# Patient Record
Sex: Male | Born: 1980 | Race: Asian | Hispanic: No | Marital: Married | Smoking: Former smoker
Health system: Southern US, Community
[De-identification: ages and names within clinical notes are randomized; demographics above are authoritative.]

## PROBLEM LIST (undated history)

## (undated) DIAGNOSIS — E785 Hyperlipidemia, unspecified: Secondary | ICD-10-CM

## (undated) DIAGNOSIS — E119 Type 2 diabetes mellitus without complications: Secondary | ICD-10-CM

## (undated) DIAGNOSIS — I10 Essential (primary) hypertension: Secondary | ICD-10-CM

## (undated) DIAGNOSIS — F419 Anxiety disorder, unspecified: Secondary | ICD-10-CM

## (undated) HISTORY — DX: Hyperlipidemia, unspecified: E78.5

## (undated) HISTORY — DX: Type 2 diabetes mellitus without complications: E11.9

## (undated) HISTORY — DX: Anxiety disorder, unspecified: F41.9

## (undated) HISTORY — DX: Essential (primary) hypertension: I10

---

## 2013-04-03 ENCOUNTER — Encounter (HOSPITAL_COMMUNITY): Payer: Self-pay | Admitting: Psychiatry

## 2013-04-03 ENCOUNTER — Ambulatory Visit (INDEPENDENT_AMBULATORY_CARE_PROVIDER_SITE_OTHER): Payer: 59 | Admitting: Psychiatry

## 2013-04-03 VITALS — BP 130/80 | HR 64 | Resp 12 | Ht 68.0 in | Wt 185.2 lb

## 2013-04-03 DIAGNOSIS — F329 Major depressive disorder, single episode, unspecified: Secondary | ICD-10-CM

## 2013-04-03 MED ORDER — ESCITALOPRAM OXALATE 10 MG PO TABS: 10.0000 mg | ORAL_TABLET | Freq: Every day | ORAL | Status: DC
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
## 2013-04-08 ENCOUNTER — Ambulatory Visit (INDEPENDENT_AMBULATORY_CARE_PROVIDER_SITE_OTHER): Payer: 59 | Admitting: Marriage and Family Therapist

## 2013-04-08 DIAGNOSIS — F329 Major depressive disorder, single episode, unspecified: Secondary | ICD-10-CM
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
## 2013-04-14 ENCOUNTER — Ambulatory Visit (INDEPENDENT_AMBULATORY_CARE_PROVIDER_SITE_OTHER): Payer: 59 | Admitting: Marriage and Family Therapist

## 2013-04-14 DIAGNOSIS — F329 Major depressive disorder, single episode, unspecified: Secondary | ICD-10-CM

## 2013-04-14 NOTE — Progress Notes (Signed)
   THERAPIST PROGRESS NOTE  Session Time:  1:00 - 2:00 p.m.  Participation Level: Active  Behavioral Response: CasualAlertDepressed (mild)  Type of Therapy: Individual Therapy  Treatment Goals addressed: Coping  Interventions: Strength-based and Supportive  Summary: Adrian Reed is a 32 y.o. male who presents with depression.  He was referred by Dr. Lolly Mustache.  Patient reports his depression is steadily improving.  He reports talking to his girlfriend about seeing over time what will happen with their relationship.  He reports his girlfriend is in therapy to help her figure out how to decide what to do with their relationship.  He also states his mother decided to go to therapy over the incident as well.  Patient signed his treatment plan.  Suicidal/Homicidal: Nowithout intent/plan  Therapist Response:  Asked for update relating to how patient is coping. Patient admitted that since coming to therapy he realized "there is a lot more going on in my head than I realized" (i.e., thought process).  Also reviewed his treatment plan including letting him know he would be regularly assessed for suicide.  Began treatment beginning with explaining cognitive-behavioral therapy and patient identified cognitive distortions he uses including personalizing, overgeneralizing, ruminating, and using "should" statements.  His homework is to identify when he is using cognitive distortions.  Also introduced him to biofeedback and explained meditation since patient states he has never meditated.  Gave him brochure on [employer] biofeedback for him to review.  Plan: Return again in 1 week.  Diagnosis: Axis I: Depressive d/o NOS    Axis II: Deferred    Ashleymarie Granderson, LMFT, CTS 04/14/2013

## 2013-04-21 ENCOUNTER — Ambulatory Visit (INDEPENDENT_AMBULATORY_CARE_PROVIDER_SITE_OTHER): Payer: 59 | Admitting: Psychiatry

## 2013-04-21 ENCOUNTER — Encounter (HOSPITAL_COMMUNITY): Payer: Self-pay | Admitting: Psychiatry

## 2013-04-21 VITALS — BP 152/92 | HR 76 | Wt 191.4 lb

## 2013-04-21 DIAGNOSIS — F329 Major depressive disorder, single episode, unspecified: Secondary | ICD-10-CM

## 2013-04-21 MED ORDER — ESCITALOPRAM OXALATE 10 MG PO TABS: 10.0000 mg | ORAL_TABLET | Freq: Every day | ORAL | Status: DC
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
## 2013-04-24 ENCOUNTER — Ambulatory Visit (HOSPITAL_COMMUNITY): Payer: 59 | Admitting: Psychiatry

## 2013-04-30 ENCOUNTER — Ambulatory Visit (INDEPENDENT_AMBULATORY_CARE_PROVIDER_SITE_OTHER): Payer: 59 | Admitting: Marriage and Family Therapist

## 2013-04-30 DIAGNOSIS — F329 Major depressive disorder, single episode, unspecified: Secondary | ICD-10-CM
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
## 2013-05-06 ENCOUNTER — Ambulatory Visit (INDEPENDENT_AMBULATORY_CARE_PROVIDER_SITE_OTHER): Payer: 59 | Admitting: Marriage and Family Therapist

## 2013-05-06 DIAGNOSIS — F329 Major depressive disorder, single episode, unspecified: Secondary | ICD-10-CM
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
## 2013-05-13 ENCOUNTER — Other Ambulatory Visit (HOSPITAL_COMMUNITY): Payer: Self-pay | Admitting: *Deleted

## 2013-05-13 DIAGNOSIS — F329 Major depressive disorder, single episode, unspecified: Secondary | ICD-10-CM

## 2013-05-14 ENCOUNTER — Telehealth (HOSPITAL_COMMUNITY): Payer: Self-pay

## 2013-05-14 ENCOUNTER — Ambulatory Visit (HOSPITAL_COMMUNITY): Payer: 59 | Admitting: Marriage and Family Therapist

## 2013-05-14 MED ORDER — ESCITALOPRAM OXALATE 10 MG PO TABS: 10.0000 mg | ORAL_TABLET | Freq: Every day | ORAL | Status: DC

## 2013-05-26 NOTE — Progress Notes (Signed)
   THERAPIST PROGRESS NOTE  Session Time:  3:00 - 4:00 p.m.  Participation Level: Active  Behavioral Response: CasualAlertFlat Affect/Depression (mild)  Type of Therapy: Individual Therapy  Treatment Goals addressed: Coping  Interventions: Strength-based, Biofeedback and Supportive  Summary: Adrian Reed is a 32 y.o. male who presents with depression.  He was referred by Dr. Lolly Mustache.  Patient reports he has been doing better.  He reports being able to cope with feeling sad by distracting himself with work and football.  He reports a few days ago when the weather turned colder he began to feel sad, almost crying, because he thought about what he and his fiance were doing last fall.  He reports talking to her about both taking time out versus having contact through emails and texting because "I can't keep going indefinitely like this."    Suicidal/Homicidal: Nowithout intent/plan  Therapist Response:  Assessed patient's depression which seems mild based on self-report.  Also, patient appears to be grieving moreso than depressed and talked about ways patient could go on himself without his girlfriend.  Discussed activities that had nothing to do with her but more with activities he enjoys.  He did say he likes football and watches it with a friend but that is about all other than going to work and coming home.  Patient committed to finding something else to do.  Also introduced patient to [facility] biofeedback software.  His heart-rate variability was illustrated through the software.  He was unable to maintain any coherence (calm, 3% of the time) but when he was taught a breathing technique adding a positive feeling he at least received coherence 29% of the time.  Talked to patient about his susceptibility to being more "cerebral" or intellectual and how these breathing techniques can help him to identify his feeling much sooner.  Gave the example of patient trying to hurt himself over his  fiance because he "tends to hold his feelings in."  Patient agreed.  His homework is also to do HeartMath breathing techniques five minutes before getting out of bed, 1-2 minutes throughout the day, and 10 minutes before going to bed.  He will report back next week.  Plan: Return again in 1 weeks.  Diagnosis: Axis I: Depressive Disorder NOS    Axis II: Deferred    Kylie Gros, LMFT, CTS 05/26/2013

## 2013-05-27 ENCOUNTER — Ambulatory Visit (INDEPENDENT_AMBULATORY_CARE_PROVIDER_SITE_OTHER): Payer: 59 | Admitting: Marriage and Family Therapist

## 2013-05-27 DIAGNOSIS — F329 Major depressive disorder, single episode, unspecified: Secondary | ICD-10-CM

## 2013-06-02 ENCOUNTER — Encounter (HOSPITAL_COMMUNITY): Payer: Self-pay | Admitting: Psychiatry

## 2013-06-02 ENCOUNTER — Ambulatory Visit (INDEPENDENT_AMBULATORY_CARE_PROVIDER_SITE_OTHER): Payer: 59 | Admitting: Psychiatry

## 2013-06-02 VITALS — BP 132/74 | HR 78 | Ht 68.0 in | Wt 198.6 lb

## 2013-06-02 DIAGNOSIS — F329 Major depressive disorder, single episode, unspecified: Secondary | ICD-10-CM

## 2013-06-02 MED ORDER — ESCITALOPRAM OXALATE 10 MG PO TABS: 10.0000 mg | ORAL_TABLET | Freq: Every day | ORAL | Status: DC
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
## 2013-06-10 ENCOUNTER — Encounter (INDEPENDENT_AMBULATORY_CARE_PROVIDER_SITE_OTHER): Payer: Self-pay

## 2013-06-10 ENCOUNTER — Ambulatory Visit (INDEPENDENT_AMBULATORY_CARE_PROVIDER_SITE_OTHER): Payer: 59 | Admitting: Marriage and Family Therapist

## 2013-06-10 DIAGNOSIS — F329 Major depressive disorder, single episode, unspecified: Secondary | ICD-10-CM

## 2013-06-11 NOTE — Progress Notes (Signed)
   THERAPIST PROGRESS NOTE  Session Time:  2:00 - 3:00 p.m.  Participation Level: Active  Behavioral Response: CasualAlertNo symptoms  Type of Therapy: Individual Therapy  Treatment Goals addressed: Coping  Interventions: Biofeedback/supportive/strength-based  Summary: Adrian Reed is a 32 y.o. male who presents with depression.  Patient was referred by Dr. Lolly Mustache.  Patient states he has been feeling no depression and had no incidences for the past four weeks where he was feeling depressed.  He reports he has had some communication with his fiance although less so since they decided she needed to work on her situation first.  He did say he noticed he needs to do more socially other than come home and go to work.  He reports spending time with a friend over the weekend and that they actually went out.    Suicidal/Homicidal: Nowithout intent/plan  Therapist Response:   Patient came in stating for about the last two session he has felt that he is "okay."  He believes he feels good enough to stop therapy at this time.  He states he saw Dr. Lolly Mustache last week and thinks the medication is working for him.   He reports he saw no opportunity to practice the biofeedback breathing techniques he learned in the last session but thought the biofeedback would be beneficial to him if he needed it.  Explained to patient he needs to do the breathing technique before he feels overwhelmed and stressed as a preventive intervention.  Assessed patient's depression and he does appear to have at least four weeks depression-free.  Discussed how to proceed and recommended patient come back in one month to make sure he really does feel his symptoms have improved. Told him to call if he needs to get in sooner than one month.  Patient did make an appointment for one month.  Plan: Return again in 1 month.  Diagnosis: Axis I: Depressive d/o NOS    Axis II: Deferred    Milagros Middendorf, LMFT, CTS 06/11/2013

## 2013-06-17 ENCOUNTER — Ambulatory Visit (HOSPITAL_COMMUNITY): Payer: 59 | Admitting: Marriage and Family Therapist

## 2013-06-24 ENCOUNTER — Ambulatory Visit (HOSPITAL_COMMUNITY): Payer: 59 | Admitting: Marriage and Family Therapist

## 2013-07-08 ENCOUNTER — Ambulatory Visit (INDEPENDENT_AMBULATORY_CARE_PROVIDER_SITE_OTHER): Payer: 59 | Admitting: Marriage and Family Therapist

## 2013-07-08 DIAGNOSIS — F3289 Other specified depressive episodes: Secondary | ICD-10-CM

## 2013-07-08 DIAGNOSIS — F329 Major depressive disorder, single episode, unspecified: Secondary | ICD-10-CM

## 2013-07-09 NOTE — Progress Notes (Signed)
   THERAPIST PROGRESS NOTE  Session Time:  4:00 - 5:00 p.m.  Participation Level: Active  Behavioral Response: CasualAlertDepressed (none noted at session)  Type of Therapy: Individual Therapy  Treatment Goals addressed: Coping  Interventions: Supportive  Summary: Adrian Reed is a 32 y.o. male who presents with depression.  He was referred by Dr. Lolly Mustache.  Patient reports he has noticed "not depression."  He admitted that he is not sure if the medication was working, but added "I guess I'd know if I tried to get off of it."  He reports he has an appointment with Dr. Lolly Mustache next month.  Patient states he has seen his fiance and has had one therapy session, but says he "felt like he was being blamed for some of the problems between them."  He reports telling his fiance that he will not go on indefinitely waiting for her to decide whether or not she wants to be with patient.  He admitted his feeling are diminishing for her and that he wants to go on with his life including moving out from his mother's house.  He also states he and his mother had a "good talk" about his suicide attempt but also what he is presently thinking and feeling.  He reports he has been going out with friends and that there are no problems at work.  Suicidal/Homicidal: Nowithout intent/plan - patient reports he has had no suicidal ideation since the initial attempt in July, 2014.  Therapist Response:  This session was held as a follow-up to evaluate if patient is ready to discontinue therapy at this time due to decrease in depression and how he is coping with same.  Discussed treatment plan and goal and patient states he "feels like he got what he can out of therapy and understands how to cope with his reaction to the event that lead up to his suicide attempt."  Discussed specifically how he is coping by thinking that he wants to move on with his life, he is spending time with friends, thinking of moving back on his  own.  Talked about his not taking himself off of an antidepressant without talking to Dr. Lolly Mustache.  Discussed the idea that patient can return at any time if needed and patient agreed.  Plan: Return again per PRN.  Diagnosis: Axis I: Depressive Disorder NOS    Axis II: Deferred    Jakirah Zaun, LMFT, CTS 07/09/2013

## 2013-09-01 ENCOUNTER — Ambulatory Visit (HOSPITAL_COMMUNITY): Payer: 59 | Admitting: Psychiatry

## 2013-09-10 ENCOUNTER — Encounter (HOSPITAL_COMMUNITY): Payer: Self-pay | Admitting: Psychiatry

## 2013-09-10 ENCOUNTER — Ambulatory Visit (INDEPENDENT_AMBULATORY_CARE_PROVIDER_SITE_OTHER): Payer: 59 | Admitting: Psychiatry

## 2013-09-10 VITALS — BP 141/91 | HR 80 | Ht 68.0 in | Wt 201.0 lb

## 2013-09-10 DIAGNOSIS — F3289 Other specified depressive episodes: Secondary | ICD-10-CM

## 2013-09-10 DIAGNOSIS — F329 Major depressive disorder, single episode, unspecified: Secondary | ICD-10-CM

## 2013-09-10 MED ORDER — ESCITALOPRAM OXALATE 10 MG PO TABS: 10.0000 mg | ORAL_TABLET | Freq: Every day | ORAL | Status: DC
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
## 2013-12-15 ENCOUNTER — Encounter (HOSPITAL_COMMUNITY): Payer: Self-pay | Admitting: Psychiatry

## 2013-12-15 ENCOUNTER — Ambulatory Visit (INDEPENDENT_AMBULATORY_CARE_PROVIDER_SITE_OTHER): Payer: 59 | Admitting: Psychiatry

## 2013-12-15 VITALS — BP 145/90 | HR 75 | Ht 68.0 in | Wt 200.8 lb

## 2013-12-15 DIAGNOSIS — F329 Major depressive disorder, single episode, unspecified: Secondary | ICD-10-CM

## 2013-12-15 DIAGNOSIS — F3289 Other specified depressive episodes: Secondary | ICD-10-CM

## 2013-12-15 MED ORDER — ESCITALOPRAM OXALATE 10 MG PO TABS: 10.0000 mg | ORAL_TABLET | Freq: Every day | ORAL | Status: DC
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
## 2014-01-29 DIAGNOSIS — E119 Type 2 diabetes mellitus without complications: Secondary | ICD-10-CM | POA: Insufficient documentation

## 2014-01-29 DIAGNOSIS — E1165 Type 2 diabetes mellitus with hyperglycemia: Secondary | ICD-10-CM | POA: Insufficient documentation

## 2014-03-16 ENCOUNTER — Ambulatory Visit (INDEPENDENT_AMBULATORY_CARE_PROVIDER_SITE_OTHER): Payer: 59 | Admitting: Psychiatry

## 2014-03-16 ENCOUNTER — Encounter (HOSPITAL_COMMUNITY): Payer: Self-pay | Admitting: Psychiatry

## 2014-03-16 VITALS — BP 141/95 | HR 78 | Ht 68.0 in | Wt 203.8 lb

## 2014-03-16 DIAGNOSIS — F321 Major depressive disorder, single episode, moderate: Secondary | ICD-10-CM

## 2014-03-16 MED ORDER — ESCITALOPRAM OXALATE 10 MG PO TABS: 10.0000 mg | ORAL_TABLET | Freq: Every day | ORAL | Status: DC

## 2014-03-16 NOTE — Progress Notes (Signed)
[facility] [id number] Progress Note  Adrian Reed [id number] 33 y.o.  03/16/2014 10:27 AM  Chief Complaint:  Medication management and followup.    History of Present Illness:  Adrian Reed came for his followup appointment.  He is taking his medication as prescribed.  He denies any major anxiety or any panic attack.  He is less depressed and less anxious and he likes his current psychotropic medication.  He is sleeping better.  His appetite is okay.  He denies any crying spells, feelings of hopelessness or worthlessness.  He saw his primary care physician last month.  He mentioned his hemoglobin A1c was 7.  He is taking higher dose of oral hypoglycemic medication.  Patient denies any paranoia, hallucination or any agitation.  He is trying to keep himself busy at work.  He also started gym on a regular basis.  Patient do not have any relationship or friendship with his previous girlfriend.  Patient mentioned that he moved on to his new life.  Patient does not drink or use any illegal substances.  He wants to continue Lexapro 10 mg.  He denies any tremors or shakes or any other side effects.  Suicidal Ideation: No Plan Formed: No Patient has means to carry out plan: No  Homicidal Ideation: No Plan Formed: No Patient has means to carry out plan: No  Review of Systems: Psychiatric: Agitation: No Hallucination: No Depressed Mood: No Insomnia: No Hypersomnia: No Altered Concentration: No Feels Worthless: No Grandiose Ideas: No Belief In Special Powers: No New/Increased Substance Abuse: No Compulsions: No  Neurologic: Headache: No Seizure: No Paresthesias: No  Medical History;  Patient has diabetes mellitus, hypertension and hyperlipidemia.  He sees Dr. Fredderick SeveranceJohn Tipton at [facility].    Outpatient Encounter Prescriptions as of 03/16/2014  Medication Sig  . acarbose (PRECOSE) 100 MG tablet Take 100 mg by mouth.  Marland Kitchen. atorvastatin (LIPITOR) 10 MG tablet Take 10 mg by mouth  daily.  Marland Kitchen. escitalopram (LEXAPRO) 10 MG tablet Take 1 tablet (10 mg total) by mouth daily.  Marland Kitchen. glipiZIDE (GLUCOTROL XL) 10 MG 24 hr tablet Take 10 mg by mouth.  Marland Kitchen. lisinopril (PRINIVIL,ZESTRIL) 10 MG tablet Take 10 mg by mouth.  . [DISCONTINUED] escitalopram (LEXAPRO) 10 MG tablet Take 1 tablet (10 mg total) by mouth daily.  . [DISCONTINUED] acarbose (PRECOSE) 25 MG tablet Take 25 mg by mouth 3 (three) times daily.  . [DISCONTINUED] glipiZIDE (GLUCOTROL XL) 5 MG 24 hr tablet 5 mg daily.    No results found for this or any previous visit (from the past 2160 hour(s)).  Past Psychiatric History/Hospitalization(s)  Anxiety: Yes Bipolar Disorder: No Depression: Yes Mania: No Psychosis: No Schizophrenia: No Personality Disorder: No Hospitalization for psychiatric illness: Yes History of Electroconvulsive Shock Therapy: No Prior Suicide Attempts: No  Physical Exam: Constitutional:  BP 141/95  Pulse 78  Ht 5\' 8"  (1.727 m)  Wt 203 lb 12.8 oz (92.443 kg)  BMI 30.99 kg/m2  Musculoskeletal: Strength & Muscle Tone: within normal limits Gait & Station: normal Patient leans: N/A  Mental Status Examination;  Patient is well dressed and well groomed.  He described his mood as good and his affect is mood appropriate. His speech is clear and coherent.  His thought process is slow but logical and goal directed.  He denies any active or passive suicidal thoughts or homicidal thoughts.  He denied any auditory or visual hallucination.  There were no tremors or shakes.  There were no paranoia, delusions obsession present at this time.  There were no flight if ideas or any loose association.  He is alert and oriented x3.  His insight judgment and impulse control is okay.   Established Problem, Stable/Improving (1), Review of Last Therapy Session (1) and Review of New Medication or Change in Dosage (2)  Assessment: Axis I: Depressive disorder NOS  Axis II: Deferred  Axis III:  Past Medical History   Diagnosis Date  . Diabetes mellitus, type II   . HTN (hypertension)   . Hyperlipidemia     Axis IV: Mild   Plan:  Patient is doing better on Lexapro 10 mg daily.  He does not report any side effects.  Recommend continue Lexapro.  Encouraged to keep exercise and watching his calorie intake.  A [facility] of [facility]  Recommend to call us back if he has any question or any concern.  Follow up in 3 months.    Jazzman Loughmiller T., MD 03/16/2014

## 2014-06-16 ENCOUNTER — Ambulatory Visit (HOSPITAL_COMMUNITY): Payer: 59 | Admitting: Psychiatry

## 2014-06-22 ENCOUNTER — Ambulatory Visit (INDEPENDENT_AMBULATORY_CARE_PROVIDER_SITE_OTHER): Payer: 59 | Admitting: Psychiatry

## 2014-06-22 ENCOUNTER — Encounter (HOSPITAL_COMMUNITY): Payer: Self-pay | Admitting: Psychiatry

## 2014-06-22 VITALS — BP 118/82 | HR 78 | Ht 69.0 in | Wt 198.2 lb

## 2014-06-22 DIAGNOSIS — F329 Major depressive disorder, single episode, unspecified: Secondary | ICD-10-CM

## 2014-06-22 DIAGNOSIS — F321 Major depressive disorder, single episode, moderate: Secondary | ICD-10-CM

## 2014-06-22 MED ORDER — ESCITALOPRAM OXALATE 10 MG PO TABS: 10.0000 mg | ORAL_TABLET | Freq: Every day | ORAL | Status: DC

## 2014-06-22 NOTE — Progress Notes (Signed)
[facility] [id number] [facility]  Adrian Reed [id number] 33 y.o.  06/22/2014 4:14 PM  Chief Complaint:  Medication management and followup.    History of Present Illness:  Adrian Reed came for his followup appointment.  He is compliant with his Lexapro.  He denies any side effects of medication.  Last week he has a blood work at his primary care physician for hemoglobin A1c.  He do not know the results.  However he is watching his calorie intake.  He lost 5 pounds from the last visit.  His energy level is good.  He denies any irritability, anger, crying spells or any feeling of hopelessness or worthlessness.  His sleep is good.  He is going to gym on a regular basis.  At this time he is not interested in a new relationship.  Patient does not drink or use any illegal substances.  He is working at [employer] and he likes his job.    Suicidal Ideation: No Plan Formed: No Patient has means to carry out plan: No  Homicidal Ideation: No Plan Formed: No Patient has means to carry out plan: No  Review of Systems: Psychiatric: Agitation: No Hallucination: No Depressed Mood: No Insomnia: No Hypersomnia: No Altered Concentration: No Feels Worthless: No Grandiose Ideas: No Belief In Special Powers: No New/Increased Substance Abuse: No Compulsions: No  Neurologic: Headache: No Seizure: No Paresthesias: No  Medical History;  Patient has diabetes mellitus, hypertension and hyperlipidemia.  He sees Dr. Fredderick SeveranceJohn Reed at [facility].    Outpatient Encounter Prescriptions as of 06/22/2014  Medication Sig  . atorvastatin (LIPITOR) 10 MG tablet Take 10 mg by mouth daily.  Marland Kitchen. escitalopram (LEXAPRO) 10 MG tablet Take 1 tablet (10 mg total) by mouth daily.  Marland Kitchen. glipiZIDE (GLUCOTROL XL) 10 MG 24 hr tablet Take 10 mg by mouth.  Marland Kitchen. lisinopril (PRINIVIL,ZESTRIL) 10 MG tablet Take 10 mg by mouth.  . [DISCONTINUED] escitalopram (LEXAPRO) 10 MG tablet Take 1 tablet (10 mg total) by mouth  daily.    No results found for this or any previous visit (from the past 2160 hour(s)).  Past Psychiatric History/Hospitalization(s)  Anxiety: Yes Bipolar Disorder: No Depression: Yes Mania: No Psychosis: No Schizophrenia: No Personality Disorder: No Hospitalization for psychiatric illness: Yes History of Electroconvulsive Shock Therapy: No Prior Suicide Attempts: No  Physical Exam: Constitutional:  BP 118/82  Pulse 78  Ht 5\' 9"  (1.753 m)  Wt 198 lb 3.2 oz (89.903 kg)  BMI 29.26 kg/m2  Musculoskeletal: Strength & Muscle Tone: within normal limits Gait & Station: normal Patient leans: N/A  Mental Status Examination;  Patient is well dressed and well groomed.  He maintains good eye contact. He described his mood okay and his affect is mood appropriate.  His speech is clear and coherent.  His thought process is slow but logical and goal directed.  He denies any active or passive suicidal thoughts or homicidal thoughts.  He denied any auditory or visual hallucination.  There were no tremors or shakes.  There were no paranoia, delusions obsession present at this time.  There were no flight if ideas or any loose association.  He is alert and oriented x3.  His insight judgment and impulse control is okay.   Established Problem, Stable/Improving (1), Review of Last Therapy Session (1) and Review of New Medication or Change in Dosage (2)  Assessment: Axis I: Depressive disorder NOS  Axis II: Deferred  Axis III:  Past Medical History  Diagnosis Date  . Diabetes mellitus, type  II   . HTN (hypertension)   . Hyperlipidemia     Axis IV: Mild   Plan:  Patient is doing better on Lexapro 10 mg daily.  He does not report any side effects.  I recommended to have his blood work faxed to us .  Encouraged to keep exercise and watching his calorie intake.  A [facility] of [facility]  Recommend to call us back if he has any question or any concern.   Follow up in 3 months.    Renise Gillies T., MD 06/22/2014

## 2014-09-22 ENCOUNTER — Ambulatory Visit (INDEPENDENT_AMBULATORY_CARE_PROVIDER_SITE_OTHER): Payer: 59 | Admitting: Psychiatry

## 2014-09-22 ENCOUNTER — Encounter (HOSPITAL_COMMUNITY): Payer: Self-pay | Admitting: Psychiatry

## 2014-09-22 DIAGNOSIS — F321 Major depressive disorder, single episode, moderate: Secondary | ICD-10-CM

## 2014-09-22 DIAGNOSIS — F329 Major depressive disorder, single episode, unspecified: Secondary | ICD-10-CM

## 2014-09-22 MED ORDER — ATORVASTATIN CALCIUM 10 MG PO TABS: 10.0000 mg | ORAL_TABLET | Freq: Every day | ORAL | Status: DC

## 2014-09-22 NOTE — Progress Notes (Addendum)
[id number]  Adrian Reed [id number] 34 y.o.  09/22/2014 3:51 PM  Chief Complaint:  Medication management and followup.    History of Present Illness:  Adrian Reed came for his followup appointment.  He is taking his medication and now he is thinking to come off from the antidepressant.  He is doing very well.  Sometime he is complaining of job stressful but there has been no new issues.  He social active and sleeping good.  He had a good Christmas.  He is able to spend time with his family.  Patient denies any crying spells, irritability, anger or any feeling of hopelessness or worthlessness.  Recently he has a blood work and his hemoglobin A1c is increased.  He is now taking 3 medication to lower his blood sugar.  He is concerned about it like to restart exercise.  He admitted lately drinking alcohol but denies any intoxication or any tremors.  He is hoping to cut down his drinking but also like to cut down his medication.  Patient denies any illegal substance use. He is working at [employer].     Suicidal Ideation: No Plan Formed: No Patient has means to carry out plan: No  Homicidal Ideation: No Plan Formed: No Patient has means to carry out plan: No  Review of Systems: Psychiatric: Agitation: No Hallucination: No Depressed Mood: No Insomnia: No Hypersomnia: No Altered Concentration: No Feels Worthless: No Grandiose Ideas: No Belief In Special Powers: No New/Increased Substance Abuse: No Compulsions: No  Neurologic: Headache: No Seizure: No Paresthesias: No  Medical History;  Patient has diabetes mellitus, hypertension and hyperlipidemia.  He sees Dr. Fredderick SeveranceJohn Tipton at [facility].    Outpatient Encounter Prescriptions as of 09/22/2014  Medication Sig  . acarbose (PRECOSE) 100 MG tablet   . atorvastatin (LIPITOR) 10 MG tablet Take 1 tablet (10 mg total) by mouth daily.  Marland Kitchen. escitalopram (LEXAPRO) 10 MG tablet Take 1 tablet (10 mg total) by  mouth daily.  Marland Kitchen. glipiZIDE (GLUCOTROL XL) 10 MG 24 hr tablet Take 10 mg by mouth.  Marland Kitchen. lisinopril (PRINIVIL,ZESTRIL) 10 MG tablet Take 10 mg by mouth.  . TRADJENTA 5 MG TABS tablet   . [DISCONTINUED] atorvastatin (LIPITOR) 10 MG tablet Take 10 mg by mouth daily.    No results found for this or any previous visit (from the past 2160 hour(s)).  Past Psychiatric History/Hospitalization(s)  Anxiety: Yes Bipolar Disorder: No Depression: Yes Mania: No Psychosis: No Schizophrenia: No Personality Disorder: No Hospitalization for psychiatric illness: Yes History of Electroconvulsive Shock Therapy: No Prior Suicide Attempts: No  Physical Exam: Constitutional:  There were no vitals taken for this visit.  Musculoskeletal: Strength & Muscle Tone: within normal limits Gait & Station: normal Patient leans: N/A  Mental Status Examination;  Patient is well dressed and well groomed.  He maintains good eye contact. He described his mood okay and his affect is mood appropriate.  His speech is clear and coherent.  His thought process is slow but logical and goal directed.  He denies any active or passive suicidal thoughts or homicidal thoughts.  He denied any auditory or visual hallucination.  There were no tremors or shakes.  There were no paranoia, delusions obsession present at this time.  There were no flight if ideas or any loose association.  He is alert and oriented x3.  His insight judgment and impulse control is okay.   Established Problem, Stable/Improving (1), Review or order clinical lab tests (1), Review of Last Therapy  Session (1), Review of Medication Regimen & Side Effects (2) and Review of New Medication or Change in Dosage (2)  Assessment: Axis I: Depressive disorder NOS  Axis II: Deferred  Axis III:  Past Medical History  Diagnosis Date  . Diabetes mellitus, type II   . HTN (hypertension)   . Hyperlipidemia     Plan:  Patient wants to cut down his Lexapro.  I recommended  to try half tablet and if he started to feel that his symptoms are coming back then he can go back to 10 mg.  Recommended to stop drinking .  Recommended to call us back if he has any question or any concern.  He is taking 3 oral hypoglycemic medication because his hemoglobin A1c is 7.  Encouraged to watch his calorie intake and do that her exercise.  Follow-up in 3 months.  ARFEEN,SYED T., MD 09/22/2014

## 2014-11-24 ENCOUNTER — Other Ambulatory Visit (HOSPITAL_COMMUNITY): Payer: Self-pay | Admitting: Psychiatry

## 2014-11-29 NOTE — Telephone Encounter (Signed)
Medication refill request for patient's Lexapro.  Tried to reach patient to verify how he is taking based on note from 09/22/14 but no answer and unable to leave a message due to no identifying information on the voicemail.  Patient returns on 12/22/14 so will follow up with Dr. Lolly MustacheArfeen to question refill.

## 2014-11-30 NOTE — Telephone Encounter (Signed)
He supposed to take 10 mg Lexapro.

## 2014-12-22 ENCOUNTER — Encounter (HOSPITAL_COMMUNITY): Payer: Self-pay | Admitting: Psychiatry

## 2014-12-22 ENCOUNTER — Ambulatory Visit (INDEPENDENT_AMBULATORY_CARE_PROVIDER_SITE_OTHER): Payer: 59 | Admitting: Psychiatry

## 2014-12-22 VITALS — BP 127/89 | HR 83 | Ht 65.0 in | Wt 199.2 lb

## 2014-12-22 DIAGNOSIS — F321 Major depressive disorder, single episode, moderate: Secondary | ICD-10-CM | POA: Diagnosis not present

## 2014-12-22 MED ORDER — ESCITALOPRAM OXALATE 5 MG PO TABS: 5.0000 mg | ORAL_TABLET | Freq: Every day | ORAL | Status: DC

## 2014-12-22 NOTE — Progress Notes (Signed)
[facility] [id number] Progress [facility]  Adrian Reed [id number] 34 y.o.  12/22/2014 9:04 AM  Chief Complaint:  I was taking 10 mg but I ran out 3 weeks ago and him feeling sometimes irritable.  I want to try low-dose medication.      History of Present Illness:  Adrian Reed came for his followup appointment.  He did not reduce the dosage of Lexapro and he continued 10 mg until he ran out 3 weeks ago and started to feel anxious and nervous.  However he still wants to come off from medication in the future.  He continued to endorse job is stressful but he is handling better.  He had a blood work in February and has him alone A1c still more than 7.  He is scheduled to see his endocrinologist next month .  He is taking 3 medicine for diabetes.  Overall he is doing better however he has noticed his energy level is somewhat decreased since he is not taking Lexapro.  He denies any feeling of hopelessness, worthlessness or any suicidal thoughts.  His appetite is okay.  His vitals are stable.  He has cut down his drinking and denies any illegal substance use.  He denies any paranoia or any hallucination.  Patient is working at [employer]  Suicidal Ideation: No Plan Formed: No Patient has means to carry out plan: No  Homicidal Ideation: No Plan Formed: No Patient has means to carry out plan: No  Review of Systems: Psychiatric: Agitation: No Hallucination: No Depressed Mood: No Insomnia: No Hypersomnia: No Altered Concentration: No Feels Worthless: No Grandiose Ideas: No Belief In Special Powers: No New/Increased Substance Abuse: No Compulsions: No  Neurologic: Headache: No Seizure: No Paresthesias: No  Medical History;  Patient has diabetes mellitus, hypertension and hyperlipidemia.  He sees Dr. Fredderick SeveranceJohn Reed at [facility].    Outpatient Encounter Prescriptions as of 12/22/2014  Medication Sig  . acarbose (PRECOSE) 100 MG tablet   . atorvastatin (LIPITOR) 10 MG tablet Take 1  tablet (10 mg total) by mouth daily.  Marland Kitchen. escitalopram (LEXAPRO) 5 MG tablet Take 1 tablet (5 mg total) by mouth daily.  Marland Kitchen. glipiZIDE (GLUCOTROL XL) 10 MG 24 hr tablet Take 10 mg by mouth.  Marland Kitchen. lisinopril (PRINIVIL,ZESTRIL) 10 MG tablet Take 10 mg by mouth.  . [DISCONTINUED] escitalopram (LEXAPRO) 10 MG tablet Take 1 tablet (10 mg total) by mouth daily.  . meloxicam (MOBIC) 15 MG tablet   . TRADJENTA 5 MG TABS tablet     No results found for this or any previous visit (from the past 2160 hour(s)).  Past Psychiatric History/Hospitalization(s)  Anxiety: Yes Bipolar Disorder: No Depression: Yes Mania: No Psychosis: No Schizophrenia: No Personality Disorder: No Hospitalization for psychiatric illness: Yes History of Electroconvulsive Shock Therapy: No Prior Suicide Attempts: No  Physical Exam: Constitutional:  BP 127/89 mmHg  Pulse 83  Ht 5\' 5"  (1.651 m)  Wt 199 lb 3.2 oz (90.357 kg)  BMI 33.15 kg/m2  Musculoskeletal: Strength & Muscle Tone: within normal limits Gait & Station: normal Patient leans: N/A  Mental Status Examination;  Patient is well dressed and well groomed.  He maintains good eye contact. He described his mood anxious and his affect is mood appropriate.  His attention concentration is fair.  His speech is clear and coherent.  His thought process is slow but logical and goal directed.  He denies any active or passive suicidal thoughts or homicidal thoughts.  He denied any auditory or visual hallucination.  There were no tremors or shakes.  There were no paranoia, delusions obsession present at this time.  There were no flight if ideas or any loose association.  He is alert and oriented x3.  His insight judgment and impulse control is okay.   Established Problem, Stable/Improving (1), Review or order clinical lab tests (1), Review of Last Therapy Session (1), Review of Medication Regimen & Side Effects (2) and Review of New Medication or Change in Dosage  (2)  Assessment: Axis I: Depressive disorder NOS  Axis II: Deferred  Axis III:  Past Medical History  Diagnosis Date  . Diabetes mellitus, type II   . HTN (hypertension)   . Hyperlipidemia     Plan:  Discuss risk of noncompliance with medication .  I encouraged to take Lexapro 5 mg since he has some anxiety and depression.  We will consider taking him off on his next appointment if any still insists to stop medication.  I recommended if symptoms started to get worse when he should take 10 mg and call us back immediately.  Follow-up in 3 months.  Adrian Reed T., MD 12/22/2014

## 2015-01-17 DIAGNOSIS — R5383 Other fatigue: Secondary | ICD-10-CM | POA: Insufficient documentation

## 2015-03-23 ENCOUNTER — Ambulatory Visit (INDEPENDENT_AMBULATORY_CARE_PROVIDER_SITE_OTHER): Payer: 59 | Admitting: Psychiatry

## 2015-03-23 ENCOUNTER — Encounter (HOSPITAL_COMMUNITY): Payer: Self-pay | Admitting: Psychiatry

## 2015-03-23 VITALS — BP 140/90 | HR 81 | Temp 97.9°F | Resp 18 | Ht 67.0 in | Wt 196.4 lb

## 2015-03-23 DIAGNOSIS — F321 Major depressive disorder, single episode, moderate: Secondary | ICD-10-CM

## 2015-03-23 DIAGNOSIS — F329 Major depressive disorder, single episode, unspecified: Secondary | ICD-10-CM

## 2015-03-23 MED ORDER — ESCITALOPRAM OXALATE 5 MG PO TABS: 5.0000 mg | ORAL_TABLET | Freq: Every day | ORAL | Status: DC

## 2015-03-23 NOTE — Progress Notes (Addendum)
[facility] [address] Progress Note  Adrian Reed [id number] 34 y.o.  03/23/2015 9:08 AM  Chief Complaint:  I am taking Lexapro 5 mg every other day and I think I'm ready to come off from the medication.  I recently got promoted and happy at my job.        History of Present Illness:  Adrian Reed came for his followup appointment.  He is taking Lexapro every other day 5 mg and he feels that he can, from the medication.  He denies any irritability, anger, mood swing or any insomnia.  He started a new relationship which is going very well.  Last week he is promoted and he is happy about his promotion.  He is seeing endocrinologist for his diabetes.  Patient denies any racing thoughts, insomnia, crying spells or any feeling of hopelessness or worthlessness.  Patient denies drinking or using any illegal substances.  His appetite is okay.  He still struggling to control his blood pressure but he is more cautious about his health and diet.  He has lost 5 pounds from his last visit.  His energy level is good.  Patient is working at [employer].  Suicidal Ideation: No Plan Formed: No Patient has means to carry out plan: No  Homicidal Ideation: No Plan Formed: No Patient has means to carry out plan: No  Review of Systems: Psychiatric: Agitation: No Hallucination: No Depressed Mood: No Insomnia: No Hypersomnia: No Altered Concentration: No Feels Worthless: No Grandiose Ideas: No Belief In Special Powers: No New/Increased Substance Abuse: No Compulsions: No  Neurologic: Headache: No Seizure: No Paresthesias: No  Medical History;  Patient has diabetes mellitus, hypertension and hyperlipidemia.  He sees Dr. Fredderick Severance at [facility].    Outpatient Encounter Prescriptions as of 03/23/2015  Medication Sig  . acarbose (PRECOSE) 100 MG tablet   . atorvastatin (LIPITOR) 10 MG tablet Take 1 tablet (10 mg total) by mouth daily.  Marland Kitchen escitalopram (LEXAPRO) 5 MG tablet Take 1 tablet (5  mg total) by mouth daily.  Marland Kitchen glipiZIDE (GLUCOTROL XL) 10 MG 24 hr tablet Take 10 mg by mouth.  Marland Kitchen lisinopril (PRINIVIL,ZESTRIL) 10 MG tablet Take 10 mg by mouth.  . meloxicam (MOBIC) 15 MG tablet   . TRADJENTA 5 MG TABS tablet   . [DISCONTINUED] escitalopram (LEXAPRO) 5 MG tablet Take 1 tablet (5 mg total) by mouth daily.   No facility-administered encounter medications on file as of 03/23/2015.    No results found for this or any previous visit (from the past 2160 hour(s)).  Past Psychiatric History/Hospitalization(s)  Anxiety: Yes Bipolar Disorder: No Depression: Yes Mania: No Psychosis: No Schizophrenia: No Personality Disorder: No Hospitalization for psychiatric illness: Yes History of Electroconvulsive Shock Therapy: No Prior Suicide Attempts: No  Physical Exam: Constitutional:  BP 140/90 mmHg  Pulse 81  Temp(Src) 97.9 F (36.6 C)  Resp 18  Ht  (1.702 m)  Wt 196 lb 6.4 oz (89.086 kg)  BMI 30.75 kg/m2  SpO2 100%  Musculoskeletal: Strength & Muscle Tone: within normal limits Gait & Station: normal Patient leans: N/A  Mental Status Examination;  Patient is well dressed and well groomed.  He maintains good eye contact. He described his mood euthymic and his affect is appropriate.  His attention concentration is fair.  His speech is clear and coherent.  His thought process is slow but logical and goal directed.  He denies any active or passive suicidal thoughts or homicidal thoughts.  He denied any auditory or visual  hallucination.  There were no tremors or shakes.  There were no paranoia, delusions obsession present at this time.  There were no flight if ideas or any loose association.  He is alert and oriented x3.  His insight judgment and impulse control is okay.   Established Problem, Stable/Improving (1), Review of Last Therapy Session (1) and Review of Medication Regimen & Side Effects (2)  Assessment: Axis I: Depressive disorder NOS  Axis II:  Deferred  Axis III:  Past Medical History  Diagnosis Date  . Diabetes mellitus, type II   . HTN (hypertension)   . Hyperlipidemia     Plan:  Patient like to come off from Lexapro .  He is only taking 5 mg every other day.  He does not see any worsening of depression .  We discuss risk of noncompliance with medication in the future.  However patient promised that he will call us if he started to feel that his symptoms are coming back.  We will provide a prescription of Lexapro 5 mg if needed in the future.  I will see him in 3 months as a follow-up and if patient remains symptom free we will sign off.  I recommended to call us back if he has any question or any concern.  Nicholette Dolson T., MD 03/23/2015

## 2015-05-11 ENCOUNTER — Other Ambulatory Visit (HOSPITAL_COMMUNITY): Payer: Self-pay | Admitting: Psychiatry

## 2015-05-12 ENCOUNTER — Other Ambulatory Visit (HOSPITAL_COMMUNITY): Payer: Self-pay | Admitting: Psychiatry

## 2015-05-12 NOTE — Telephone Encounter (Signed)
Patient has decided to come off from Lexapro.

## 2015-06-23 ENCOUNTER — Encounter (HOSPITAL_COMMUNITY): Payer: Self-pay | Admitting: Psychiatry

## 2015-06-23 ENCOUNTER — Ambulatory Visit (INDEPENDENT_AMBULATORY_CARE_PROVIDER_SITE_OTHER): Payer: 59 | Admitting: Psychiatry

## 2015-06-23 VITALS — BP 122/84 | HR 84 | Ht 68.0 in | Wt 194.8 lb

## 2015-06-23 DIAGNOSIS — F330 Major depressive disorder, recurrent, mild: Secondary | ICD-10-CM

## 2015-06-23 NOTE — Progress Notes (Signed)
[facility] [id number] Progress Note  Adrian Reed [id number] 34 y.o.  06/23/2015 9:03 AM  Chief Complaint:  I am doing better without medication.   History of Present Illness:  Izeah came for his followup appointment.  He had to stop taking Lexapro 2 months ago and he is doing fine.  His job is going very well.  He started new relationship which is going very well.  He admitted some time poor sleep but denies any irritability, anger, mood swing.  Recently he had headaches and he is recommended to see neurologist .  He had appointment in 2 weeks.  Overall he described his mood much better.  He denies any crying spells, anhedonia, any feeling of hopelessness or worthlessness.  His appetite is okay.  He denies any paranoia, hallucination or any mood swings.  He admitted some time issues in his new relationship but he is handling very well.  His family is pleased with his new relationship.  He does not feel that he needed to go back on antidepressant.  Patient denies drinking or using any illegal substances.  His appetite is okay.  His vitals are stable.  He is checking his blood sugar on a regular basis.  Next week he will see his endocrinologist.  Patient is working at [employer].  Suicidal Ideation: No Plan Formed: No Patient has means to carry out plan: No  Homicidal Ideation: No Plan Formed: No Patient has means to carry out plan: No  Review of Systems: Psychiatric: Agitation: No Hallucination: No Depressed Mood: No Insomnia: No Hypersomnia: No Altered Concentration: No Feels Worthless: No Grandiose Ideas: No Belief In Special Powers: No New/Increased Substance Abuse: No Compulsions: No  Neurologic: Headache: No Seizure: No Paresthesias: No  Medical History;  Patient has diabetes mellitus, hypertension and hyperlipidemia.  He sees Dr. Fredderick Severance at [facility].    Outpatient Encounter Prescriptions as of 06/23/2015  Medication Sig  . acarbose (PRECOSE)  100 MG tablet   . ACCU-CHEK COMPACT PLUS test strip   . fluticasone (FLONASE) 50 MCG/ACT nasal spray   . glipiZIDE (GLUCOTROL XL) 10 MG 24 hr tablet Take 10 mg by mouth.  Marland Kitchen lisinopril (PRINIVIL,ZESTRIL) 10 MG tablet Take 10 mg by mouth.  . TRADJENTA 5 MG TABS tablet   . [DISCONTINUED] atorvastatin (LIPITOR) 10 MG tablet Take 1 tablet (10 mg total) by mouth daily.  . [DISCONTINUED] escitalopram (LEXAPRO) 5 MG tablet Take 1 tablet (5 mg total) by mouth daily.  . [DISCONTINUED] meloxicam (MOBIC) 15 MG tablet    No facility-administered encounter medications on file as of 06/23/2015.    No results found for this or any previous visit (from the past 2160 hour(s)).  Past Psychiatric History/Hospitalization(s)  Anxiety: Yes Bipolar Disorder: No Depression: Yes Mania: No Psychosis: No Schizophrenia: No Personality Disorder: No Hospitalization for psychiatric illness: Yes History of Electroconvulsive Shock Therapy: No Prior Suicide Attempts: No  Physical Exam: Constitutional:  BP 122/84 mmHg  Pulse 84  Ht  (1.727 m)  Wt 194 lb 12.8 oz (88.361 kg)  BMI 29.63 kg/m2  Musculoskeletal: Strength & Muscle Tone: within normal limits Gait & Station: normal Patient leans: N/A  Mental Status Examination;  Patient is well dressed and well groomed.  He maintains good eye contact. He described his mood euthymic and his affect is appropriate.  His attention concentration is good. His speech is clear and coherent.  His thought process is slow but logical and goal directed.  He denies any active or passive  suicidal thoughts or homicidal thoughts.  He denied any auditory or visual hallucination.  There were no tremors or shakes.  There were no paranoia, delusions obsession present at this time.  There were no flight if ideas or any loose association.  He is alert and oriented x3.  His insight judgment and impulse control is okay.   Established Problem, Stable/Improving (1), Review of Last  Therapy Session (1) and Review of Medication Regimen & Side Effects (2)  Assessment: Axis I: Depressive disorder NOS  Axis II: Deferred  Axis III:  Past Medical History  Diagnosis Date  . Diabetes mellitus, type II (HCC)   . HTN (hypertension)   . Hyperlipidemia     Plan:  Patient is not taking Lexapro for past 2 months.  He had gradually taper off his Lexapro.  His job is going very well and he started the relationship.  We discuss risk factors and sign and symptoms of relapse and patient promised that if symptoms started to come back he will call us immediately.  I also suggested if he need any counseling or therapy for coping and social skills that he should call us to schedule appointment.  Patient agreed with the plan at this time we will not schedule any more appointments .  However discuss that he will call us if symptoms started to get worse.  His safety plan that anytime having active suicidal thoughts or homicidal thought but he need to call 911 or go to the local emergency room.  Jane Birkel T., MD 06/23/2015

## 2015-07-01 DIAGNOSIS — R519 Headache, unspecified: Secondary | ICD-10-CM | POA: Insufficient documentation

## 2015-07-01 DIAGNOSIS — G43019 Migraine without aura, intractable, without status migrainosus: Secondary | ICD-10-CM | POA: Insufficient documentation

## 2015-08-31 ENCOUNTER — Other Ambulatory Visit: Payer: Self-pay | Admitting: Neurology

## 2015-08-31 DIAGNOSIS — G4489 Other headache syndrome: Secondary | ICD-10-CM

## 2015-09-22 ENCOUNTER — Ambulatory Visit
Admission: RE | Admit: 2015-09-22 | Discharge: 2015-09-22 | Disposition: A | Discharge status: Home / Self Care | Payer: 59 | Source: Ambulatory Visit | Attending: Neurology | Admitting: Neurology

## 2015-09-22 DIAGNOSIS — G4489 Other headache syndrome: Secondary | ICD-10-CM | POA: Diagnosis present

## 2015-09-22 MED ORDER — GADOBENATE DIMEGLUMINE 529 MG/ML IV SOLN
20.0000 mL | Freq: Once | INTRAVENOUS | Status: AC | PRN
  Administered 2015-09-22: 18 mL via INTRAVENOUS

## 2015-10-04 DIAGNOSIS — E559 Vitamin D deficiency, unspecified: Secondary | ICD-10-CM | POA: Insufficient documentation

## 2017-01-19 IMAGING — MR MR HEAD WO/W CM
10 series · 46 of 48 positions shown · IV contrast (18 ML MULTIHANCE)
Comparison: None.

CLINICAL DATA: Headache syndrome

EXAM:
MRI HEAD WITHOUT AND WITH CONTRAST
TECHNIQUE: Multiplanar, multiecho pulse sequences of the brain and surrounding
structures were obtained without and with intravenous contrast.
CONTRAST:  18mL MULTIHANCE GADOBENATE DIMEGLUMINE 529 MG/ML IV SOLN

[Series 2: T1 · sagittal · 5.0mm · 0.45mm/px · 3 of 25 slices shown (1 of 2)]
[im 1/25]
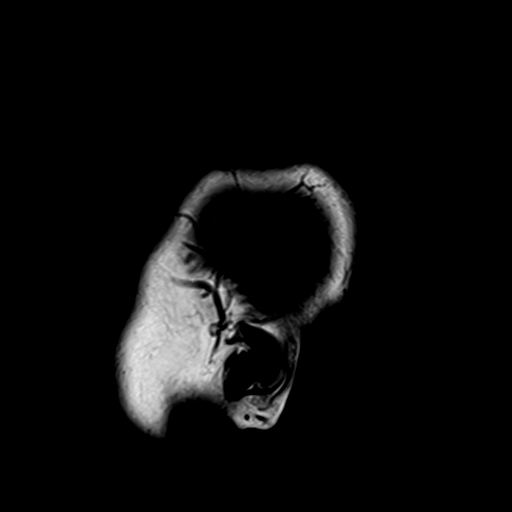
[im 13/25]
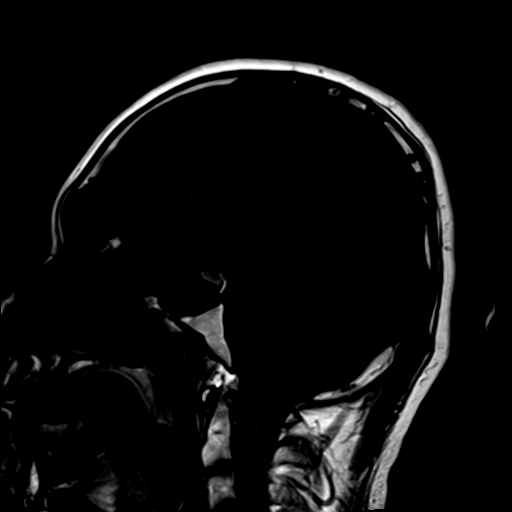
[im 25/25]
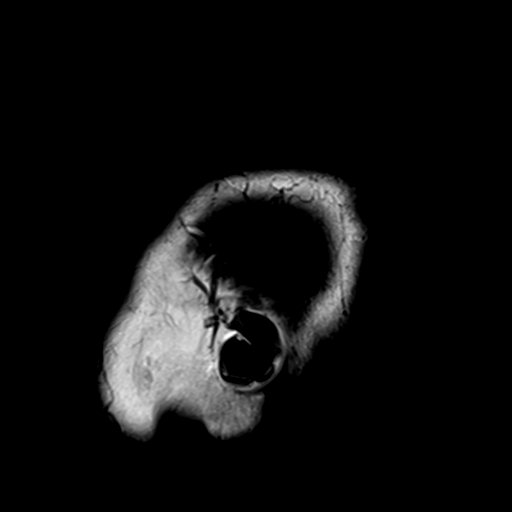

[Series 4: DWI · axial · 3.0mm · 1.20mm/px · z∈[-53,+108]mm · 7 of 56 slices shown (1 of 2)]
[im 1/56]
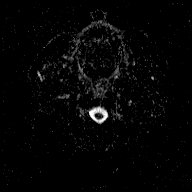
[im 10/56]
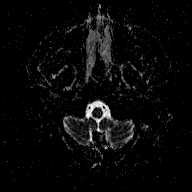
[im 19/56]
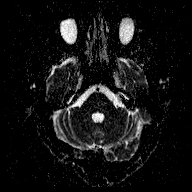
[im 28/56]
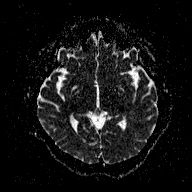
[im 37/56]
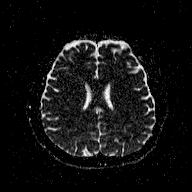
[im 46/56]
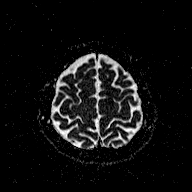
[im 56/56]
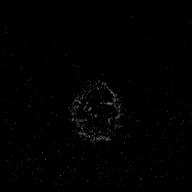

[Series 5: T2 · axial · 5.0mm · 0.72mm/px · z∈[-51,+107]mm · 3 of 26 slices shown (1 of 2)]
[im 1/26]
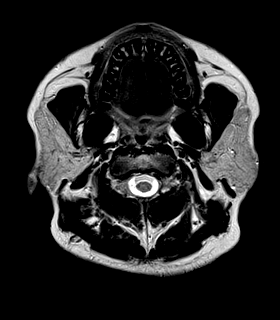
[im 13/26]
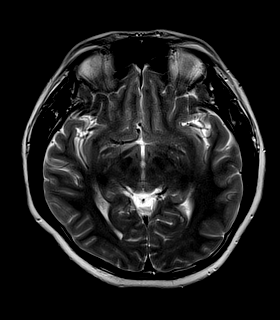
[im 26/26]
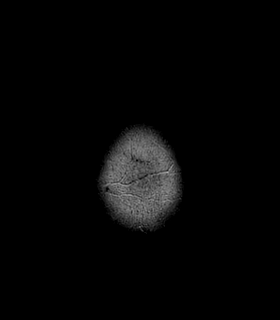

[Series 6: FLAIR · axial · 5.0mm · 0.45mm/px · z∈[-51,+107]mm · 3 of 26 slices shown]
[im 1/26]
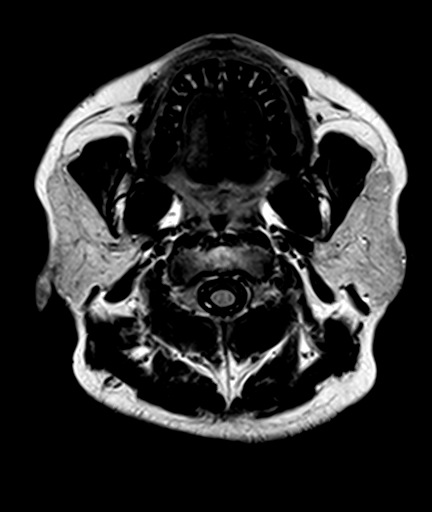
[im 13/26]
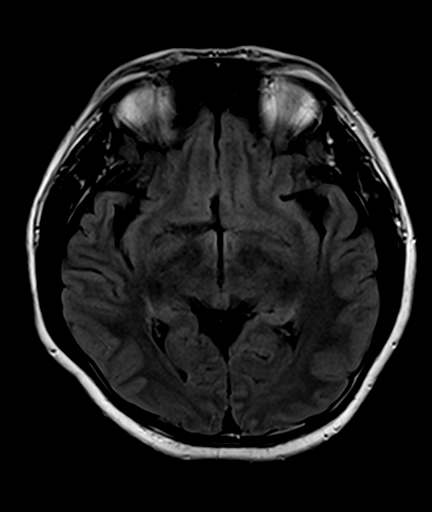
[im 26/26]
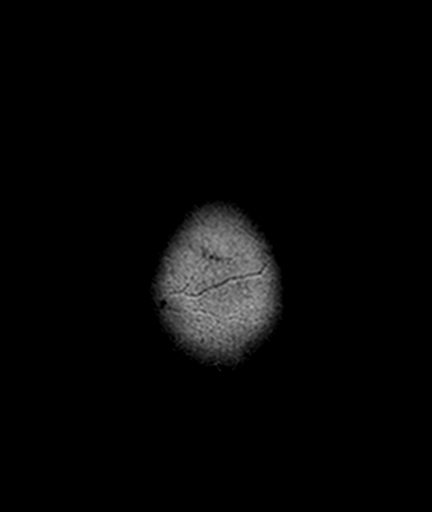

[Series 7: T2 · axial · 5.0mm · 0.72mm/px · z∈[-51,+107]mm · 3 of 26 slices shown (2 of 2)]
[im 1/26]
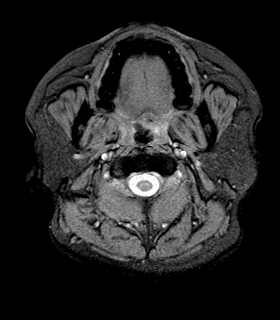
[im 13/26]
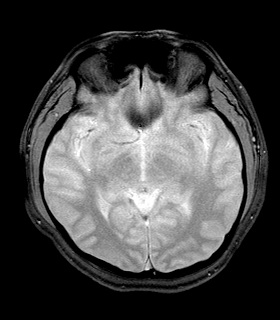
[im 26/26]
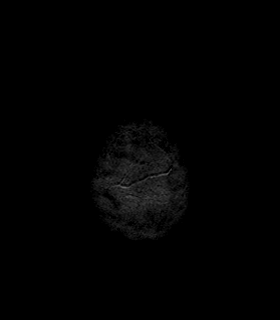

[Series 8: T1 · axial · 3.0mm · 1.00mm/px · z∈[-61,+70]mm · 6 of 64 slices shown (2 of 2)]
[im 1/64]
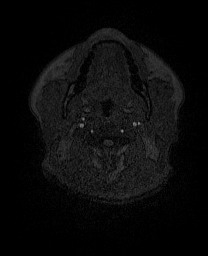
[im 10/64]
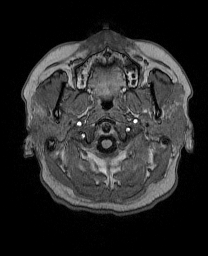
[im 19/64]
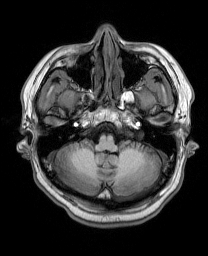
[im 28/64]
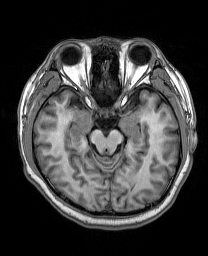
[im 37/64]
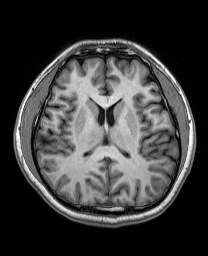
[im 46/64]
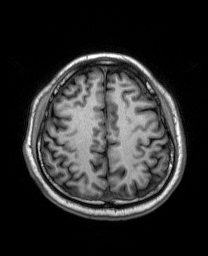

[Series 9: T2 post-contrast · coronal · 5.0mm · 0.45mm/px · 3 of 29 slices shown]
[im 1/29]
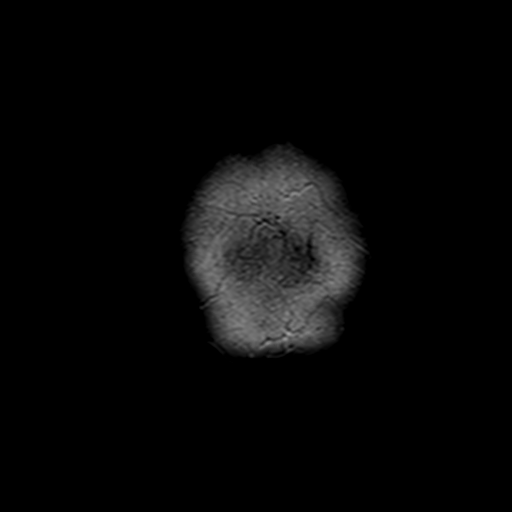
[im 15/29]
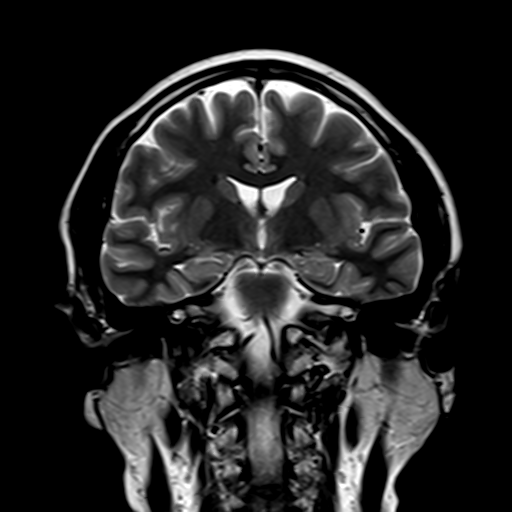
[im 29/29]
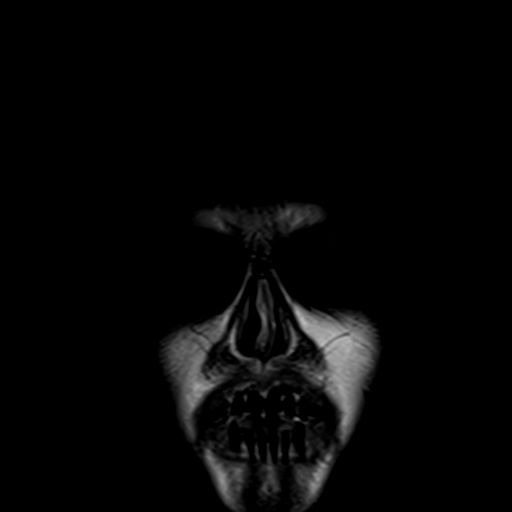

[Series 10: T1 post-contrast · axial · 3.0mm · 1.00mm/px · z∈[-61,+123]mm · 8 of 64 slices shown (1 of 2)]
[im 1/64]
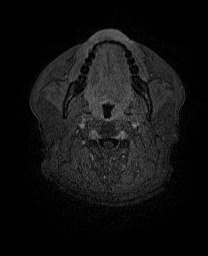
[im 10/64]
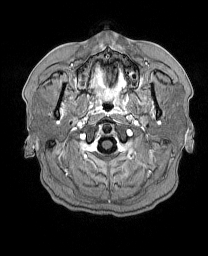
[im 19/64]
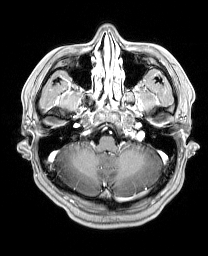
[im 28/64]
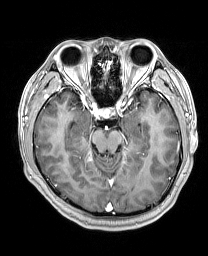
[im 37/64]
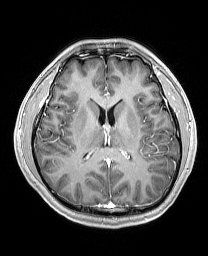
[im 46/64]
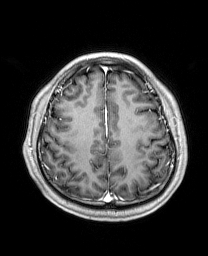
[im 55/64]
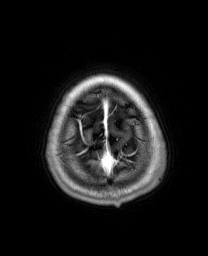
[im 64/64]
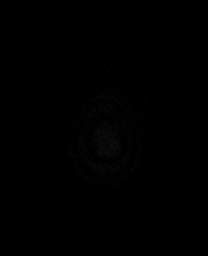

[Series 11: T1 post-contrast · coronal · 5.0mm · 0.45mm/px · 3 of 29 slices shown (2 of 2)]
[im 1/29]
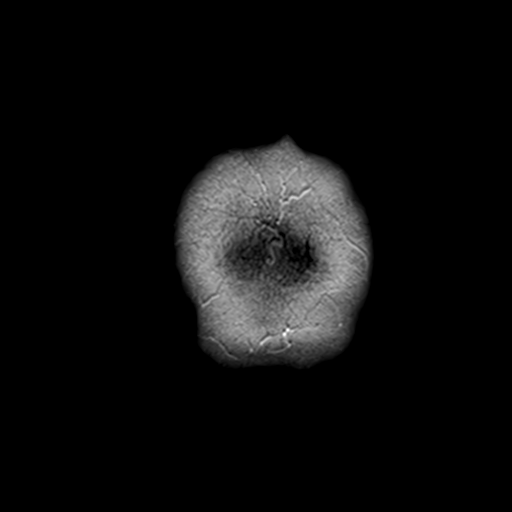
[im 15/29]
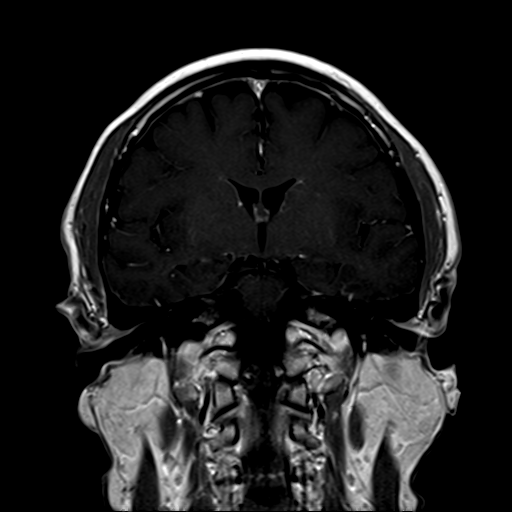
[im 29/29]
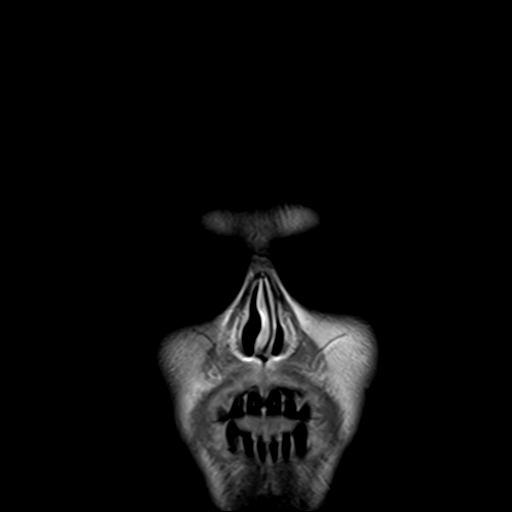

[Series 100: DWI · axial · 3.0mm · 1.20mm/px · z∈[-53,+108]mm · 7 of 56 slices shown (2 of 2)]
[im 1/56]
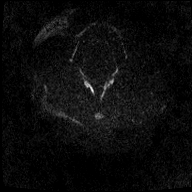
[im 10/56]
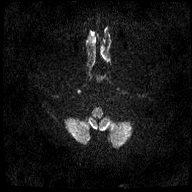
[im 19/56]
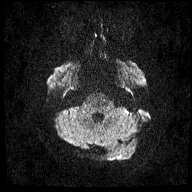
[im 28/56]
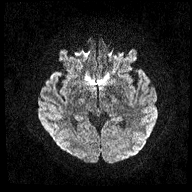
[im 37/56]
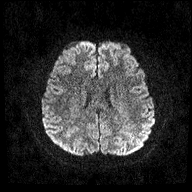
[im 46/56]
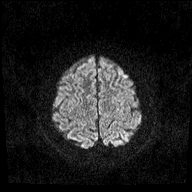
[im 56/56]
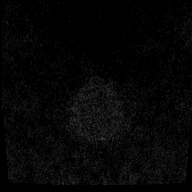

[46 of 48 positions shown; findings below may reference images not displayed]

FINDINGS: Ventricle size normal. Cerebral volume normal. Craniocervical
junction normal. Negative for Chiari malformation.

Pituitary normal in size. Normal orbit. Paranasal sinuses clear. No
skull lesion identified.

Skull base is normal.  Vessels at the base of the brain are patent.

Negative for acute or chronic infarction.

Negative for demyelinating disease. Cerebral white matter is normal.
Brainstem and basal ganglia normal.

Negative for hemorrhage or mass lesion.  No edema

Normal enhancement following contrast infusion. No enhancing mass
lesion. Normal vascular enhancement.
IMPRESSION: Normal MRI of the brain with contrast

## 2021-07-14 ENCOUNTER — Other Ambulatory Visit (HOSPITAL_COMMUNITY): Payer: Self-pay

## 2021-07-14 MED ORDER — JANUVIA 100 MG PO TABS
100.0000 mg | ORAL_TABLET | Freq: Every morning | ORAL | 0 refills | Status: DC
  Filled 2021-07-14: qty 30, 30d supply, fill #0

## 2021-07-25 ENCOUNTER — Other Ambulatory Visit (HOSPITAL_COMMUNITY): Payer: Self-pay

## 2022-05-04 ENCOUNTER — Ambulatory Visit
Admission: RE | Admit: 2022-05-04 | Discharge: 2022-05-04 | Disposition: A | Discharge status: Home / Self Care | Payer: 59 | Source: Ambulatory Visit

## 2022-05-04 VITALS — BP 144/93 | HR 77 | Temp 98.0°F | Resp 16

## 2022-05-04 DIAGNOSIS — I10 Essential (primary) hypertension: Secondary | ICD-10-CM

## 2022-05-04 DIAGNOSIS — R030 Elevated blood-pressure reading, without diagnosis of hypertension: Secondary | ICD-10-CM | POA: Diagnosis not present

## 2022-05-04 DIAGNOSIS — F419 Anxiety disorder, unspecified: Secondary | ICD-10-CM

## 2022-05-04 NOTE — ED Provider Notes (Signed)
[facility]  Note:  This document was prepared using Conservation officer, historic buildings and may include unintentional dictation errors.  MRN: [id number] DOB: 06/28/1981  Subjective:   Adrian Reed is a 41 y.o. male presenting for check on his blood pressure.  Patient checks it regularly at home as he gets very worried and anxious about his blood pressure.  Sometimes he checks it multiple times a day.  States that it would fluctuate randomly between 120-150 systolic.  He has taken blood pressure medicine with lisinopril.  Takes 10 mg.  Has a PCP that is helping to manage this.  Has a history of migraines. Had some this past week. Last episode was yesterday, resolved with Excedrin. No smoking, occasional alcohol drink.  He does admit that he experiences a lot of anxiety and is not currently taking anything to help with this.  He and his wife are attempting a pregnancy and therefore he is trying Clomid.  Otherwise, no vision changes, weakness, numbness or tingling, chest pain, abdominal pain, hematuria.  No current facility-administered medications for this encounter.  Current Outpatient Medications:    acarbose (PRECOSE) 100 MG tablet, , Disp: , Rfl:    CLOMID 50 MG tablet, Take 50 mg by mouth 3 (three) times a week., Disp: , Rfl:    glipiZIDE (GLUCOTROL XL) 10 MG 24 hr tablet, Take 10 mg by mouth., Disp: , Rfl:    lisinopril (PRINIVIL,ZESTRIL) 10 MG tablet, Take 10 mg by mouth., Disp: , Rfl:    ACCU-CHEK COMPACT PLUS test strip, , Disp: , Rfl:    fluticasone (FLONASE) 50 MCG/ACT nasal spray, , Disp: , Rfl:    sitaGLIPtin (JANUVIA) 100 MG tablet, Take 1 tablet (100 mg total) by mouth every morning before breakfast to lower blood sugars, Disp: 30 tablet, Rfl: 0   TRADJENTA 5 MG TABS tablet, , Disp: , Rfl:    Allergies  Allergen Reactions   Metformin Hcl Diarrhea    Stomach pain    Past Medical History:  Diagnosis Date   Diabetes mellitus, type II (HCC)     HTN (hypertension)    Hyperlipidemia      History reviewed. No pertinent surgical history.  History reviewed. No pertinent family history.  Social History   Tobacco Use   Smoking status: Former  Substance Use Topics   Alcohol use: No    Alcohol/week: 0.0 standard drinks of alcohol   Drug use: No    ROS   Objective:   Vitals: BP (!) 144/93 (BP Location: Right Arm)   Pulse 77   Temp 98 F (36.7 C) (Oral)   Resp 16   SpO2 97%   BP Readings from Last 3 Encounters:  05/04/22 (!) 144/93   BP recheck 131/91.  Physical Exam Constitutional:      General: He is not in acute distress.    Appearance: Normal appearance. He is well-developed. He is not ill-appearing, toxic-appearing or diaphoretic.  HENT:     Head: Normocephalic and atraumatic.     Right Ear: External ear normal.     Left Ear: External ear normal.     Nose: Nose normal.     Mouth/Throat:     Mouth: Mucous membranes are moist.  Eyes:     General: No scleral icterus.       Right eye: No discharge.        Left eye: No discharge.     Extraocular Movements: Extraocular movements intact.     Pupils:  Pupils are equal, round, and reactive to light.  Cardiovascular:     Rate and Rhythm: Normal rate and regular rhythm.     Heart sounds: Normal heart sounds. No murmur heard.    No friction rub. No gallop.  Pulmonary:     Effort: Pulmonary effort is normal. No respiratory distress.     Breath sounds: Normal breath sounds. No stridor. No wheezing, rhonchi or rales.  Neurological:     Mental Status: He is alert and oriented to person, place, and time.     Cranial Nerves: No cranial nerve deficit.     Motor: No weakness.     Coordination: Coordination normal.     Gait: Gait normal.     Deep Tendon Reflexes: Reflexes normal.     Comments: No facial asymmetry. Negative Romberg and pronator drift.   Psychiatric:        Mood and Affect: Mood normal.        Behavior: Behavior normal.        Thought Content:  Thought content normal.        Judgment: Judgment normal.     Assessment and Plan :   PDMP not reviewed this encounter.  1. Essential hypertension   2. Elevated blood pressure reading   3. Anxiety    I will avoid changes to his medications today. Want to make sure he checks.  Blood pressure recheck was more reassuring.  Discussed general ways to check his blood pressure at home.  Patient will do so and follow-up with his PCP. Counseled patient on potential for adverse effects with medications prescribed/recommended today, ER and return-to-clinic precautions discussed, patient verbalized understanding.    Wallis Bamberg, [place] 05/04/22 1415

## 2022-05-04 NOTE — ED Triage Notes (Signed)
Patient c/o hypertension x 3 days.   Patient denies Chest Pain, Dizziness, SOB, or Palpitations.   Patient endorses highest BP was 145/100 at home.   Patient endorses headache at times.   Patient endorses history of HTN. Patient currently takes Lisinopril 10 mg daily for management.

## 2022-05-04 NOTE — Discharge Instructions (Addendum)

## 2022-06-12 ENCOUNTER — Ambulatory Visit (HOSPITAL_BASED_OUTPATIENT_CLINIC_OR_DEPARTMENT_OTHER): Payer: 59 | Admitting: Family Medicine

## 2022-06-12 ENCOUNTER — Encounter (HOSPITAL_BASED_OUTPATIENT_CLINIC_OR_DEPARTMENT_OTHER): Payer: Self-pay | Admitting: Family Medicine

## 2022-06-12 DIAGNOSIS — E785 Hyperlipidemia, unspecified: Secondary | ICD-10-CM | POA: Diagnosis not present

## 2022-06-12 DIAGNOSIS — I10 Essential (primary) hypertension: Secondary | ICD-10-CM | POA: Diagnosis not present

## 2022-06-12 DIAGNOSIS — E1165 Type 2 diabetes mellitus with hyperglycemia: Secondary | ICD-10-CM

## 2022-06-12 DIAGNOSIS — E119 Type 2 diabetes mellitus without complications: Secondary | ICD-10-CM | POA: Insufficient documentation

## 2022-06-12 NOTE — Assessment & Plan Note (Signed)
Most recent hemoglobin A1c was above goal at 7.5% At this time, patient will continue with current medications and scheduled follow-up with endocrinology for any adjustments in management Recommend that he inform endocrinology office of having a new PCP so that they may forward his office notes to [place] for review

## 2022-06-12 NOTE — Assessment & Plan Note (Signed)
Blood pressure borderline in office today Recommend continuing with intermittent monitoring at home, has been well controlled at home We will continue with lisinopril at this time Recommend DASH diet

## 2022-06-12 NOTE — Assessment & Plan Note (Signed)
Has been tolerating atorvastatin, at this time we will continue with medication, no adjustments to be made today

## 2022-06-12 NOTE — Patient Instructions (Signed)
  Medication Instructions:  Your physician recommends that you continue on your current medications as directed. Please refer to the Current Medication list given to you today. --If you need a refill on any your medications before your next appointment, please call your pharmacy first. If no refills are authorized on file call the office.-- Lab Work: Your physician has recommended that you have lab work today: No If you have labs (blood work) drawn today and your tests are completely normal, you will receive your results via MyChart message OR a phone call from our staff.  Please ensure you check your voicemail in the event that you authorized detailed messages to be left on a delegated number. If you have any lab test that is abnormal or we need to change your treatment, we will call you to review the results.  Referrals/Procedures/Imaging: No  Follow-Up: Your next appointment:   Your physician recommends that you schedule a follow-up appointment in: 3 month follow-up with Dr. de [place]  You will receive a text message or e-mail with a link to a survey about your care and experience with us today! We would greatly appreciate your feedback!   Thanks for letting us be apart of your health journey!!  Primary Care and Sports Medicine   Dr. Raymond de Cuba   We encourage you to activate your patient portal called "MyChart".  Sign up information is provided on this After Visit Summary.  MyChart is used to connect with patients for Virtual Visits (Telemedicine).  Patients are able to view lab/test results, encounter notes, upcoming appointments, etc.  Non-urgent messages can be sent to your provider as well. To learn more about what you can do with MyChart, please visit --  [email or web address].    

## 2022-06-12 NOTE — Progress Notes (Signed)
New Patient Office Visit  Subjective    Patient ID: Adrian Reed, male    DOB: 23-Jul-1981  Age: 41 y.o. MRN: [id number]  CC:  Chief Complaint  Patient presents with   New Patient (Initial Visit)    Pt here to establish new care     HPI Koehn Cahn presents to establish care Last PCP - Dr. Kennon Holter, last visit 2018  DM: Patient reports that current medications include acarbose, glipizide. He is following with endocrinology, Dr. Meredith Pel - last visit was July. Last A1c was 7.5%.  He denies any current issues today.  Does have next follow-up appointment with endocrinology later this month.  HTN: Current medication includes lisinopril.  He does check his blood pressure intermittently at home.  Reports that readings tend to be at goal.  No current issues with chest pain or headaches.  HLD: Currently being prescribed atorvastatin to help with controlling cholesterol numbers related to underlying diabetes and hypertension.  No current concerns today, has been tolerating medication without issue.  Was recently started on Clomid by his urologist to help with fertility.  Patient is originally from [place], has been living here for most of his life. 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 Outside of work, he enjoys cooking, going out downtown, festivals, being outdoors.  Outpatient Encounter Medications as of 06/12/2022  Medication Sig   acarbose (PRECOSE) 100 MG tablet Take 100 mg by mouth 3 (three) times daily with meals.   ACCU-CHEK COMPACT PLUS test strip    atorvastatin (LIPITOR) 80 MG tablet Take by mouth.   CLOMID 50 MG tablet Take 50 mg by mouth 3 (three) times a week.   glipiZIDE (GLIPIZIDE XL) 5 MG 24 hr tablet    lisinopril (PRINIVIL,ZESTRIL) 10 MG tablet Take 10 mg by mouth.   [DISCONTINUED] acarbose (PRECOSE) 100 MG tablet    [DISCONTINUED] fluticasone (FLONASE) 50 MCG/ACT nasal spray    [DISCONTINUED] glipiZIDE (GLUCOTROL XL) 10 MG 24  hr tablet Take 10 mg by mouth.   [DISCONTINUED] sitaGLIPtin (JANUVIA) 100 MG tablet Take 1 tablet (100 mg total) by mouth every morning before breakfast to lower blood sugars   [DISCONTINUED] TRADJENTA 5 MG TABS tablet    No facility-administered encounter medications on file as of 06/12/2022.    Past Medical History:  Diagnosis Date   Diabetes mellitus, type II ([place])    HTN (hypertension)    Hyperlipidemia     History reviewed. No pertinent surgical history.  History reviewed. No pertinent family history.  Social History   Socioeconomic History   Marital status: Married    Spouse name: Not on file   Number of children: Not on file   Years of education: Not on file   Highest education level: Not on file  Occupational History   Not on file  Tobacco Use   Smoking status: Former   Smokeless tobacco: Not on file  Substance and Sexual Activity   Alcohol use: No    Alcohol/week: 0.0 standard drinks of alcohol   Drug use: No   Sexual activity: Not on file  Other Topics Concern   Not on file  Social History Narrative   Not on file   Social Determinants of Health   Financial Resource Strain: Not on file  Food Insecurity: Not on file  Transportation Needs: Not on file  Physical Activity: Not on file  Stress: Not on file  Social Connections: Not on file  Intimate Partner Violence:  Not on file    Objective    BP (!) 142/93   Pulse 81   Temp 97.9 F (36.6 C) (Oral)   Ht 5\' 8"  (1.727 m)   Wt 192 lb 3.2 oz (87.2 kg)   SpO2 100%   BMI 29.22 kg/m   Physical Exam  41 year old male in no acute distress Cardiovascular exam with regular rate and rhythm, no murmur appreciated Lungs clear to auscultation bilaterally  Assessment & Plan:   Problem List Items Addressed This Visit       Cardiovascular and Mediastinum   Primary hypertension    Blood pressure borderline in office today Recommend continuing with intermittent monitoring at home, has been well controlled  at home We will continue with lisinopril at this time Recommend DASH diet      Relevant Medications   atorvastatin (LIPITOR) 80 MG tablet     Endocrine   Diabetes mellitus (HCC)    Most recent hemoglobin A1c was above goal at 7.5% At this time, patient will continue with current medications and scheduled follow-up with endocrinology for any adjustments in management Recommend that he inform endocrinology office of having a new PCP so that they may forward his office notes to 24 for review      Relevant Medications   atorvastatin (LIPITOR) 80 MG tablet   glipiZIDE (GLIPIZIDE XL) 5 MG 24 hr tablet   acarbose (PRECOSE) 100 MG tablet     Other   Hyperlipidemia    Has been tolerating atorvastatin, at this time we will continue with medication, no adjustments to be made today      Relevant Medications   atorvastatin (LIPITOR) 80 MG tablet    Return in about 3 months (around 09/12/2022) for DM, HTN.  Plan to schedule CPE after next appointment  Gary Gabrielsen J De 11/11/2022, MD

## 2022-06-25 ENCOUNTER — Other Ambulatory Visit: Payer: Self-pay

## 2022-07-31 ENCOUNTER — Other Ambulatory Visit: Payer: Self-pay

## 2022-08-01 ENCOUNTER — Encounter (HOSPITAL_BASED_OUTPATIENT_CLINIC_OR_DEPARTMENT_OTHER): Payer: Self-pay

## 2022-08-30 ENCOUNTER — Other Ambulatory Visit (HOSPITAL_BASED_OUTPATIENT_CLINIC_OR_DEPARTMENT_OTHER): Payer: Self-pay

## 2022-08-30 ENCOUNTER — Other Ambulatory Visit (HOSPITAL_COMMUNITY): Payer: Self-pay

## 2022-08-30 MED ORDER — RYBELSUS 7 MG PO TABS
7.0000 mg | ORAL_TABLET | Freq: Every day | ORAL | 5 refills | Status: DC
  Filled 2022-08-30: qty 30, 30d supply, fill #0
  Filled 2022-10-02: qty 30, 30d supply, fill #1
  Filled 2022-11-01: qty 30, 30d supply, fill #2
  Filled 2022-11-28: qty 30, 30d supply, fill #3
  Filled 2022-12-28 – 2023-01-22 (×4): qty 30, 30d supply, fill #4
  Filled 2023-02-01 – 2023-06-17 (×3): qty 30, 30d supply, fill #5

## 2022-09-12 ENCOUNTER — Encounter (HOSPITAL_BASED_OUTPATIENT_CLINIC_OR_DEPARTMENT_OTHER): Payer: Self-pay | Admitting: Family Medicine

## 2022-09-12 ENCOUNTER — Ambulatory Visit (INDEPENDENT_AMBULATORY_CARE_PROVIDER_SITE_OTHER): Payer: Commercial Managed Care - PPO | Admitting: Family Medicine

## 2022-09-12 VITALS — BP 144/95 | HR 93 | Ht 68.0 in | Wt 196.4 lb

## 2022-09-12 DIAGNOSIS — I10 Essential (primary) hypertension: Secondary | ICD-10-CM

## 2022-09-12 DIAGNOSIS — E1165 Type 2 diabetes mellitus with hyperglycemia: Secondary | ICD-10-CM

## 2022-09-12 DIAGNOSIS — Z0000 Encounter for general adult medical examination without abnormal findings: Secondary | ICD-10-CM

## 2022-09-12 DIAGNOSIS — M25511 Pain in right shoulder: Secondary | ICD-10-CM | POA: Insufficient documentation

## 2022-09-12 NOTE — Progress Notes (Signed)
    Procedures performed today:    None.  Independent interpretation of notes and tests performed by another provider:   None.  Brief History, Exam, Impression, and Recommendations:    BP (!) 144/95 (BP Location: Right Arm, Patient Position: Sitting, Cuff Size: Large)   Pulse 93   Ht 5\' 8"  (1.727 m)   Wt 196 lb 6.4 oz (89.1 kg)   SpO2 100%   BMI 29.86 kg/m   Right shoulder pain Started about 2 months ago, was working out, does not recall specific injury. Describes as tightness. Has been working with PT for 3 weeks. Feels that it has slightly improved over this time. Has been doing HEP. Pain over lateral aspect of shoulder.  Right shoulder: Obvious swelling, bruising or erythema: absent Deformity of the shoulder: absent Active ROM: full range of motion Passive ROM: full range of motion Strength: normal/normal Empty can: Negative Hawkins: Positive, mild Neer's: Negative Neurovascular exam: intact  Exam generally reassuring, suspect scapular dyskinesis, potential impingement syndrome contributing to current symptoms.  No restriction in range of motion to suggest adhesive capsulitis, however discussed this as a potential complication and need for continued physical therapy and following closely with home exercise program. If symptoms are not progressing as expected, can proceed with x-rays, potential advanced imaging  Primary hypertension Blood pressure borderline on initial reading in office today, did improve some on recheck.  Patient continues with lisinopril.  No issues currently with chest pain, headaches, lightheadedness or dizziness.  Previously was better controlled with current medication regimen. At this time, will not make any changes to medication regimen.  Recommend intermittent monitoring of blood pressure at home, DASH diet.  He plans to return to exercise routine, this has been limited some related to right shoulder pain  Diabetes mellitus [facility]) Patient continues  to follow with his endocrinologist, Dr. Meredith Pel.  Last visit was a few months ago, hemoglobin A1c at that time remained stable at 7.6%, still slightly above goal.  He was started on Rybelsus, has titrated dose with his endocrinologist and reports to be doing well with this.  No new issues such as polyuria or polydipsia. Can continue with current pharmacotherapy regimen, we will plan to request records from his endocrinology office as these are not available in chart despite all of her notes being visible.  Will also look to review prior labs and update our system with recent lab results.  He indicates that he has had nephropathy screening completed with Dr. Meredith Pel  Return in about 3 months (around 12/12/2022) for CPE with [facility] a few days prior.   ___________________________________________ Aly Seidenberg de [place], MD, ABFM, CAQSM Primary Care and [place]

## 2022-09-12 NOTE — Assessment & Plan Note (Addendum)
Started about 2 months ago, was working out, does not recall specific injury. Describes as tightness. Has been working with PT for 3 weeks. Feels that it has slightly improved over this time. Has been doing HEP. Pain over lateral aspect of shoulder.  Right shoulder: Obvious swelling, bruising or erythema: absent Deformity of the shoulder: absent Active ROM: full range of motion Passive ROM: full range of motion Strength: normal/normal Empty can: Negative Hawkins: Positive, mild Neer's: Negative Neurovascular exam: intact  Exam generally reassuring, suspect scapular dyskinesis, potential impingement syndrome contributing to current symptoms.  No restriction in range of motion to suggest adhesive capsulitis, however discussed this as a potential complication and need for continued physical therapy and following closely with home exercise program. If symptoms are not progressing as expected, can proceed with x-rays, potential advanced imaging

## 2022-09-12 NOTE — Assessment & Plan Note (Signed)
Patient continues to follow with his endocrinologist, Dr. Meredith Pel.  Last visit was a few months ago, hemoglobin A1c at that time remained stable at 7.6%, still slightly above goal.  He was started on Rybelsus, has titrated dose with his endocrinologist and reports to be doing well with this.  No new issues such as polyuria or polydipsia. Can continue with current pharmacotherapy regimen, we will plan to request records from his endocrinology office as these are not available in chart despite all of her notes being visible.  Will also look to review prior labs and update our system with recent lab results.  He indicates that he has had nephropathy screening completed with Dr. Meredith Pel

## 2022-09-12 NOTE — Assessment & Plan Note (Signed)
Blood pressure borderline on initial reading in office today, did improve some on recheck.  Patient continues with lisinopril.  No issues currently with chest pain, headaches, lightheadedness or dizziness.  Previously was better controlled with current medication regimen. At this time, will not make any changes to medication regimen.  Recommend intermittent monitoring of blood pressure at home, DASH diet.  He plans to return to exercise routine, this has been limited some related to right shoulder pain

## 2022-09-12 NOTE — Patient Instructions (Signed)

## 2022-10-04 ENCOUNTER — Encounter (HOSPITAL_COMMUNITY): Payer: Self-pay

## 2022-10-04 ENCOUNTER — Other Ambulatory Visit (HOSPITAL_COMMUNITY): Payer: Self-pay

## 2022-10-15 ENCOUNTER — Telehealth (HOSPITAL_BASED_OUTPATIENT_CLINIC_OR_DEPARTMENT_OTHER): Payer: Self-pay | Admitting: Family Medicine

## 2022-10-15 NOTE — Telephone Encounter (Signed)
Please reconcile the Flu Vaccine

## 2022-11-05 ENCOUNTER — Other Ambulatory Visit (HOSPITAL_BASED_OUTPATIENT_CLINIC_OR_DEPARTMENT_OTHER): Payer: Self-pay | Admitting: Family Medicine

## 2022-11-05 ENCOUNTER — Other Ambulatory Visit (HOSPITAL_COMMUNITY): Payer: Self-pay

## 2022-11-05 MED ORDER — GLIPIZIDE ER 5 MG PO TB24
5.0000 mg | ORAL_TABLET | Freq: Every day | ORAL | 1 refills | Status: DC
  Filled 2022-11-05 – 2022-11-28 (×5): qty 30, 30d supply, fill #0
  Filled 2022-12-23 – 2022-12-26 (×2): qty 30, 30d supply, fill #1

## 2022-11-07 ENCOUNTER — Other Ambulatory Visit: Payer: Self-pay

## 2022-11-07 ENCOUNTER — Other Ambulatory Visit (HOSPITAL_COMMUNITY): Payer: Self-pay

## 2022-11-14 ENCOUNTER — Other Ambulatory Visit (HOSPITAL_BASED_OUTPATIENT_CLINIC_OR_DEPARTMENT_OTHER): Payer: Self-pay

## 2022-11-22 DIAGNOSIS — I10 Essential (primary) hypertension: Secondary | ICD-10-CM | POA: Diagnosis not present

## 2022-11-22 DIAGNOSIS — E1165 Type 2 diabetes mellitus with hyperglycemia: Secondary | ICD-10-CM | POA: Diagnosis not present

## 2022-11-22 DIAGNOSIS — E669 Obesity, unspecified: Secondary | ICD-10-CM | POA: Diagnosis not present

## 2022-11-22 DIAGNOSIS — E559 Vitamin D deficiency, unspecified: Secondary | ICD-10-CM | POA: Diagnosis not present

## 2022-11-22 DIAGNOSIS — E782 Mixed hyperlipidemia: Secondary | ICD-10-CM | POA: Diagnosis not present

## 2022-11-27 ENCOUNTER — Other Ambulatory Visit (HOSPITAL_BASED_OUTPATIENT_CLINIC_OR_DEPARTMENT_OTHER): Payer: Self-pay

## 2022-11-28 ENCOUNTER — Other Ambulatory Visit (HOSPITAL_BASED_OUTPATIENT_CLINIC_OR_DEPARTMENT_OTHER): Payer: Self-pay

## 2022-11-28 ENCOUNTER — Other Ambulatory Visit: Payer: Self-pay

## 2022-11-29 ENCOUNTER — Other Ambulatory Visit: Payer: Self-pay

## 2022-12-06 ENCOUNTER — Ambulatory Visit (HOSPITAL_BASED_OUTPATIENT_CLINIC_OR_DEPARTMENT_OTHER): Payer: Commercial Managed Care - PPO

## 2022-12-13 ENCOUNTER — Encounter (HOSPITAL_BASED_OUTPATIENT_CLINIC_OR_DEPARTMENT_OTHER): Payer: Commercial Managed Care - PPO | Admitting: Family Medicine

## 2022-12-24 ENCOUNTER — Other Ambulatory Visit (HOSPITAL_BASED_OUTPATIENT_CLINIC_OR_DEPARTMENT_OTHER): Payer: Self-pay

## 2022-12-25 ENCOUNTER — Other Ambulatory Visit (HOSPITAL_BASED_OUTPATIENT_CLINIC_OR_DEPARTMENT_OTHER): Payer: Self-pay

## 2023-01-02 ENCOUNTER — Other Ambulatory Visit: Payer: Self-pay

## 2023-01-07 ENCOUNTER — Ambulatory Visit (HOSPITAL_BASED_OUTPATIENT_CLINIC_OR_DEPARTMENT_OTHER): Payer: Commercial Managed Care - PPO

## 2023-01-07 DIAGNOSIS — Z0000 Encounter for general adult medical examination without abnormal findings: Secondary | ICD-10-CM

## 2023-01-08 LAB — TSH RFX ON ABNORMAL TO FREE T4: TSH: 2.78 u[IU]/mL (ref 0.450–4.500)

## 2023-01-08 LAB — CBC WITH DIFFERENTIAL/PLATELET
Basophils Absolute: 0 10*3/uL (ref 0.0–0.2)
Basos: 1 %
EOS (ABSOLUTE): 0.1 10*3/uL (ref 0.0–0.4)
Eos: 2 %
Hematocrit: 44.4 % (ref 37.5–51.0)
Hemoglobin: 13.3 g/dL (ref 13.0–17.7)
Immature Grans (Abs): 0 10*3/uL (ref 0.0–0.1)
Immature Granulocytes: 0 %
Lymphocytes Absolute: 3 10*3/uL (ref 0.7–3.1)
Lymphs: 45 %
MCH: 20.3 pg — ABNORMAL LOW (ref 26.6–33.0)
MCHC: 30 g/dL — ABNORMAL LOW (ref 31.5–35.7)
MCV: 68 fL — ABNORMAL LOW (ref 79–97)
Monocytes Absolute: 0.5 10*3/uL (ref 0.1–0.9)
Monocytes: 8 %
Neutrophils Absolute: 2.8 10*3/uL (ref 1.4–7.0)
Neutrophils: 44 %
Platelets: 282 10*3/uL (ref 150–450)
RBC: 6.54 x10E6/uL — ABNORMAL HIGH (ref 4.14–5.80)
RDW: 17.1 % — ABNORMAL HIGH (ref 11.6–15.4)
WBC: 6.4 10*3/uL (ref 3.4–10.8)

## 2023-01-08 LAB — LIPID PANEL
Chol/HDL Ratio: 4.4 ratio (ref 0.0–5.0)
Cholesterol, Total: 146 mg/dL (ref 100–199)
HDL: 33 mg/dL — ABNORMAL LOW (ref >39)
LDL Chol Calc (NIH): 75 mg/dL (ref 0–99)
Triglycerides: 226 mg/dL — ABNORMAL HIGH (ref 0–149)
VLDL Cholesterol Cal: 38 mg/dL (ref 5–40)

## 2023-01-08 LAB — COMPREHENSIVE METABOLIC PANEL
ALT: 55 IU/L — ABNORMAL HIGH (ref 0–44)
AST: 35 IU/L (ref 0–40)
Albumin/Globulin Ratio: 1.5 (ref 1.2–2.2)
Albumin: 4.6 g/dL (ref 4.1–5.1)
Alkaline Phosphatase: 71 IU/L (ref 44–121)
BUN/Creatinine Ratio: 11 (ref 9–20)
BUN: 12 mg/dL (ref 6–20)
Bilirubin Total: 0.3 mg/dL (ref 0.0–1.2)
CO2: 24 mmol/L (ref 20–29)
Calcium: 9.9 mg/dL (ref 8.7–10.2)
Chloride: 100 mmol/L (ref 96–106)
Creatinine, Ser: 1.12 mg/dL (ref 0.76–1.27)
Globulin, Total: 3 g/dL (ref 1.5–4.5)
Glucose: 159 mg/dL — ABNORMAL HIGH (ref 70–99)
Potassium: 5.2 mmol/L (ref 3.5–5.2)
Sodium: 138 mmol/L (ref 134–144)
Total Protein: 7.6 g/dL (ref 6.0–8.5)
eGFR: 86 mL/min/{1.73_m2} (ref >59)

## 2023-01-08 LAB — HEMOGLOBIN A1C
Est. average glucose Bld gHb Est-mCnc: 174 mg/dL
Hgb A1c MFr Bld: 7.7 % — ABNORMAL HIGH (ref 4.8–5.6)

## 2023-01-09 DIAGNOSIS — E119 Type 2 diabetes mellitus without complications: Secondary | ICD-10-CM | POA: Diagnosis not present

## 2023-01-09 LAB — HM DIABETES EYE EXAM

## 2023-01-10 ENCOUNTER — Other Ambulatory Visit: Payer: Self-pay

## 2023-01-14 ENCOUNTER — Encounter (HOSPITAL_BASED_OUTPATIENT_CLINIC_OR_DEPARTMENT_OTHER): Payer: Self-pay | Admitting: Family Medicine

## 2023-01-14 ENCOUNTER — Other Ambulatory Visit (HOSPITAL_BASED_OUTPATIENT_CLINIC_OR_DEPARTMENT_OTHER): Payer: Self-pay

## 2023-01-14 ENCOUNTER — Ambulatory Visit (INDEPENDENT_AMBULATORY_CARE_PROVIDER_SITE_OTHER): Payer: Commercial Managed Care - PPO | Admitting: Family Medicine

## 2023-01-14 VITALS — BP 126/86 | HR 104 | Ht 68.0 in | Wt 197.4 lb

## 2023-01-14 DIAGNOSIS — R718 Other abnormality of red blood cells: Secondary | ICD-10-CM

## 2023-01-14 DIAGNOSIS — Z0000 Encounter for general adult medical examination without abnormal findings: Secondary | ICD-10-CM | POA: Diagnosis not present

## 2023-01-14 NOTE — Progress Notes (Signed)
Complete physical exam  Patient: Adrian Reed   DOB: 1981/07/17   42 y.o. Male  MRN: [id number]  Subjective:    Adrian Reed is a 42 y.o. male who presents today for a complete physical exam. He reports consuming a diabetic diet. Gym/ health club routine includes cardio and mod to heavy weightlifting. He generally feels well. He reports sleeping well. He does not have additional problems to discuss today.   Reviewed labs- elevated RBC with decreased MCV, MCHC, MCH  A1c 7.7 with recent labs, followed by endocrinology- currently taking acarbose 100mg  three times daily, glipizide 5mg  daily, Rybelsus 7mg  daily   Depression screenings:    01/14/2023    2:23 PM 09/12/2022    9:21 AM 06/12/2022   10:18 AM  Depression screen PHQ 2/9  Decreased Interest 0 0 0  Down, Depressed, Hopeless 0 0 1  PHQ - 2 Score 0 0 1  Altered sleeping 1 0 1  Tired, decreased energy 0 0 1  Change in appetite 0 0 0  Feeling bad or failure about yourself  0 0 0  Trouble concentrating 0 0 0  Moving slowly or fidgety/restless 0 0 0  Suicidal thoughts 0 0 0  PHQ-9 Score 1 0 3  Difficult doing work/chores Not difficult at all Not difficult at all Somewhat difficult    Anxiety screenings:    01/14/2023    2:24 PM 09/12/2022    9:21 AM 06/12/2022   10:18 AM  GAD 7 : Generalized Anxiety Score  Nervous, Anxious, on Edge 1 0 1  Control/stop worrying 1 0 1  Worry too much - different things 0 0 1  Trouble relaxing 0 0 1  Restless 0 0 0  Easily annoyed or irritable 0 0 0  Afraid - awful might happen 1 0 1  Total GAD 7 Score 3 0 5  Anxiety Difficulty Somewhat difficult Not difficult at all Somewhat difficult   Vision: last week  Dentist: last summer   Patient Care Team: [place], Buren Kos, MD as PCP - General (Family Medicine)   Outpatient Medications Prior to Visit  Medication Sig   acarbose (PRECOSE) 100 MG tablet Take 100 mg by mouth 3 (three) times daily with meals.   atorvastatin  (LIPITOR) 80 MG tablet Take by mouth.   Blood Glucose Monitoring Suppl (CONTOUR NEXT MONITOR) w/Device KIT Use to check blood sugars once daily   glipiZIDE (GLIPIZIDE XL) 5 MG 24 hr tablet Take 1 tablet (5 mg total) by mouth daily with breakfast.   glucose blood (CONTOUR NEXT TEST) test strip Use to check FSBG In Vitro once daily   lisinopril (PRINIVIL,ZESTRIL) 10 MG tablet Take 10 mg by mouth.   RYBELSUS 7 MG TABS Take 1 tablet (7 mg total) by mouth daily at least 30 minutes before first meal, beverage or other oral medicine of the day.   [DISCONTINUED] ACCU-CHEK COMPACT PLUS test strip    [DISCONTINUED] CLOMID 50 MG tablet Take 50 mg by mouth 3 (three) times a week.   No facility-administered medications prior to visit.    Review of Systems  Constitutional:  Negative for malaise/fatigue.  Eyes:  Negative for blurred vision and double vision.  Respiratory:  Negative for cough and shortness of breath.   Cardiovascular:  Negative for chest pain.  Gastrointestinal:  Negative for abdominal pain, nausea and vomiting.  Genitourinary:  Negative for frequency and urgency.  Musculoskeletal:  Negative for myalgias.  Neurological:  Negative for dizziness, weakness and headaches.  Psychiatric/Behavioral:  Negative for depression and suicidal ideas. The patient is not nervous/anxious.    Objective:    BP 126/86   Pulse (!) 104   Ht 5\' 8"  (1.727 m)   Wt 197 lb 6.4 oz (89.5 kg)   SpO2 98%   BMI 30.01 kg/m  BP Readings from Last 3 Encounters:  01/14/23 126/86  09/12/22 (!) 144/95  06/12/22 136/86   Physical Exam Constitutional:      Appearance: Normal appearance.  HENT:     Right Ear: Tympanic membrane, ear canal and external ear normal.     Left Ear: Tympanic membrane, ear canal and external ear normal.     Nose: Nose normal.     Mouth/Throat:     Mouth: Mucous membranes are moist.     Pharynx: Oropharynx is clear.  Eyes:     Extraocular Movements: Extraocular movements intact.      Pupils: Pupils are equal, round, and reactive to light.  Cardiovascular:     Rate and Rhythm: Normal rate and regular rhythm.     Pulses: Normal pulses.     Heart sounds: Normal heart sounds.  Pulmonary:     Effort: Pulmonary effort is normal.     Breath sounds: Normal breath sounds.  Abdominal:     General: Abdomen is flat. Bowel sounds are normal.     Palpations: Abdomen is soft.  Musculoskeletal:        General: Normal range of motion.  Skin:    General: Skin is warm and dry.  Neurological:     Mental Status: He is alert.  Psychiatric:        Mood and Affect: Mood normal.        Behavior: Behavior normal.        Thought Content: Thought content normal.        Judgment: Judgment normal.     Assessment & Plan:    Routine Health Maintenance and Physical Exam  Health Maintenance  Topic Date Due   COVID-19 Vaccine (1) Never done   Hepatitis C Screening: USPSTF Recommendation to screen - Ages 18-79 yo.  01/14/2024*   HIV Screening  01/14/2024*   Yearly kidney health urinalysis for diabetes  01/14/2028*   Flu Shot  04/04/2023   Hemoglobin A1C  07/10/2023   Complete foot exam   11/22/2023   Yearly kidney function blood test for diabetes  01/07/2024   Eye exam for diabetics  01/07/2024   DTaP/Tdap/Td vaccine (2 - Td or Tdap) 07/05/2031   HPV Vaccine  Aged Out  *Topic was postponed. The date shown is not the original due date.   1. Wellness examination Routine HCM labs ordered. Labs reviewed/discussed today. Plan to repeat CBC in 3-4 months.  Review of PMH, FH, SH, medications and HM performed.  Recommend healthy diet.  Recommend approximately 150 minutes/week of moderate intensity exercise. Recommend regular dental and vision exams. Always use seatbelt/lap and shoulder restraints. Recommend using smoke alarms and checking batteries at least twice a year. Recommend using sunscreen when outside. Discussed immunization recommendations. Vaccines are UTD.  Urine  microalbumin/creatinine ratio and foot exam completed 11/22/2022 within normal results- no kidney damage.   2. Microcytosis without anemia  Review of labs, elevated RBCs and RDW- which are often elevated in iron-deficiency anemia and thalassemia. Decreased MCV, MCH, and MCHC revealing microcytic, hypochromic anemia- often also present with IDA and thalassemia. Normal hemoglobin and hematocrit indicates no active anemia at this time. Will reassess labs in 3 months to  determine cause for decreased color and size of RBCs.  - CBC with Differential/Platelet; Future - Reticulocytes; Future - Pathologist smear review; Future - Iron, TIBC and Ferritin Panel; Future - Transferrin Saturation; Future  Return in about 3 months (around 04/16/2023) for repeat labs .   Alyson Reedy, FNP

## 2023-01-21 ENCOUNTER — Other Ambulatory Visit (HOSPITAL_BASED_OUTPATIENT_CLINIC_OR_DEPARTMENT_OTHER): Payer: Self-pay | Admitting: Family Medicine

## 2023-01-21 ENCOUNTER — Other Ambulatory Visit (HOSPITAL_BASED_OUTPATIENT_CLINIC_OR_DEPARTMENT_OTHER): Payer: Self-pay

## 2023-01-21 ENCOUNTER — Encounter (HOSPITAL_BASED_OUTPATIENT_CLINIC_OR_DEPARTMENT_OTHER): Payer: Self-pay | Admitting: Family Medicine

## 2023-01-21 MED ORDER — GLIPIZIDE ER 5 MG PO TB24
5.0000 mg | ORAL_TABLET | Freq: Every day | ORAL | 0 refills | Status: DC
  Filled 2023-01-21 – 2023-01-23 (×2): qty 90, 90d supply, fill #0

## 2023-01-21 MED ORDER — GLIPIZIDE ER 5 MG PO TB24: 5.0000 mg | ORAL_TABLET | Freq: Every day | ORAL | 0 refills | Status: DC

## 2023-01-22 ENCOUNTER — Other Ambulatory Visit (HOSPITAL_BASED_OUTPATIENT_CLINIC_OR_DEPARTMENT_OTHER): Payer: Self-pay

## 2023-01-23 ENCOUNTER — Other Ambulatory Visit: Payer: Self-pay

## 2023-01-23 ENCOUNTER — Other Ambulatory Visit (HOSPITAL_BASED_OUTPATIENT_CLINIC_OR_DEPARTMENT_OTHER): Payer: Self-pay

## 2023-01-24 ENCOUNTER — Other Ambulatory Visit (HOSPITAL_BASED_OUTPATIENT_CLINIC_OR_DEPARTMENT_OTHER): Payer: Self-pay

## 2023-02-01 ENCOUNTER — Other Ambulatory Visit (HOSPITAL_BASED_OUTPATIENT_CLINIC_OR_DEPARTMENT_OTHER): Payer: Self-pay

## 2023-03-15 ENCOUNTER — Other Ambulatory Visit (HOSPITAL_COMMUNITY): Payer: Self-pay

## 2023-03-19 ENCOUNTER — Encounter (HOSPITAL_BASED_OUTPATIENT_CLINIC_OR_DEPARTMENT_OTHER): Payer: Self-pay | Admitting: Family Medicine

## 2023-03-19 ENCOUNTER — Other Ambulatory Visit: Payer: Self-pay

## 2023-03-19 ENCOUNTER — Other Ambulatory Visit (HOSPITAL_BASED_OUTPATIENT_CLINIC_OR_DEPARTMENT_OTHER): Payer: Self-pay | Admitting: Family Medicine

## 2023-03-19 ENCOUNTER — Other Ambulatory Visit (HOSPITAL_COMMUNITY): Payer: Self-pay

## 2023-03-19 MED ORDER — LISINOPRIL 10 MG PO TABS
10.0000 mg | ORAL_TABLET | Freq: Every day | ORAL | 2 refills | Status: DC
  Filled 2023-03-19: qty 90, 90d supply, fill #0
  Filled 2023-06-17: qty 90, 90d supply, fill #1
  Filled 2023-09-18: qty 90, 90d supply, fill #2

## 2023-03-19 MED ORDER — ATORVASTATIN CALCIUM 80 MG PO TABS
80.0000 mg | ORAL_TABLET | Freq: Every day | ORAL | 2 refills | Status: DC
  Filled 2023-03-19: qty 90, 90d supply, fill #0
  Filled 2023-06-17: qty 90, 90d supply, fill #1
  Filled 2023-09-18: qty 90, 90d supply, fill #2

## 2023-04-16 ENCOUNTER — Other Ambulatory Visit (HOSPITAL_BASED_OUTPATIENT_CLINIC_OR_DEPARTMENT_OTHER): Payer: Self-pay | Admitting: Family Medicine

## 2023-04-16 MED ORDER — GLIPIZIDE ER 5 MG PO TB24
5.0000 mg | ORAL_TABLET | Freq: Every day | ORAL | 0 refills | Status: DC
  Filled 2023-04-16: qty 90, 90d supply, fill #0

## 2023-04-17 ENCOUNTER — Other Ambulatory Visit (HOSPITAL_COMMUNITY): Payer: Self-pay

## 2023-04-18 ENCOUNTER — Other Ambulatory Visit (HOSPITAL_COMMUNITY): Payer: Self-pay

## 2023-05-27 ENCOUNTER — Other Ambulatory Visit (HOSPITAL_BASED_OUTPATIENT_CLINIC_OR_DEPARTMENT_OTHER): Payer: Self-pay | Admitting: Family Medicine

## 2023-05-28 ENCOUNTER — Other Ambulatory Visit: Payer: Self-pay

## 2023-05-28 MED ORDER — ACARBOSE 100 MG PO TABS
100.0000 mg | ORAL_TABLET | Freq: Three times a day (TID) | ORAL | 1 refills | Status: DC
  Filled 2023-05-28: qty 270, 90d supply, fill #0
  Filled 2023-09-18: qty 270, 90d supply, fill #1

## 2023-05-29 ENCOUNTER — Other Ambulatory Visit: Payer: Self-pay

## 2023-05-29 ENCOUNTER — Other Ambulatory Visit (HOSPITAL_COMMUNITY): Payer: Self-pay

## 2023-07-17 ENCOUNTER — Other Ambulatory Visit (HOSPITAL_COMMUNITY): Payer: Self-pay

## 2023-07-17 ENCOUNTER — Ambulatory Visit (HOSPITAL_BASED_OUTPATIENT_CLINIC_OR_DEPARTMENT_OTHER): Payer: Commercial Managed Care - PPO | Admitting: Family Medicine

## 2023-07-17 ENCOUNTER — Other Ambulatory Visit (HOSPITAL_BASED_OUTPATIENT_CLINIC_OR_DEPARTMENT_OTHER): Payer: Self-pay | Admitting: Family Medicine

## 2023-07-17 DIAGNOSIS — E1165 Type 2 diabetes mellitus with hyperglycemia: Secondary | ICD-10-CM

## 2023-07-17 DIAGNOSIS — Z7984 Long term (current) use of oral hypoglycemic drugs: Secondary | ICD-10-CM

## 2023-07-17 MED ORDER — GLIPIZIDE ER 5 MG PO TB24
5.0000 mg | ORAL_TABLET | Freq: Every day | ORAL | 0 refills | Status: DC
  Filled 2023-07-17: qty 90, 90d supply, fill #0

## 2023-07-23 ENCOUNTER — Encounter (HOSPITAL_BASED_OUTPATIENT_CLINIC_OR_DEPARTMENT_OTHER): Payer: Self-pay | Admitting: Family Medicine

## 2023-07-23 ENCOUNTER — Ambulatory Visit (HOSPITAL_BASED_OUTPATIENT_CLINIC_OR_DEPARTMENT_OTHER): Payer: Managed Care, Other (non HMO) | Admitting: Family Medicine

## 2023-07-23 ENCOUNTER — Encounter (HOSPITAL_BASED_OUTPATIENT_CLINIC_OR_DEPARTMENT_OTHER): Payer: Self-pay | Admitting: *Deleted

## 2023-07-23 VITALS — BP 127/88 | HR 104 | Ht 68.0 in | Wt 199.2 lb

## 2023-07-23 DIAGNOSIS — I10 Essential (primary) hypertension: Secondary | ICD-10-CM | POA: Diagnosis not present

## 2023-07-23 DIAGNOSIS — E1165 Type 2 diabetes mellitus with hyperglycemia: Secondary | ICD-10-CM | POA: Diagnosis not present

## 2023-07-23 DIAGNOSIS — Z7984 Long term (current) use of oral hypoglycemic drugs: Secondary | ICD-10-CM | POA: Diagnosis not present

## 2023-07-23 DIAGNOSIS — Z0000 Encounter for general adult medical examination without abnormal findings: Secondary | ICD-10-CM

## 2023-07-23 NOTE — Assessment & Plan Note (Signed)
Blood pressure borderline on initial reading in office today, did improve some on recheck.  Patient continues with lisinopril.  No issues currently with chest pain, headaches, lightheadedness or dizziness.  Does report that blood pressure measured at home is somewhat similar with systolic in the 120s and diastolic in the 70s to 80s. At this time, will not make any changes to medication regimen.  Recommend intermittent monitoring of blood pressure at home, DASH diet.

## 2023-07-23 NOTE — Progress Notes (Signed)
    Procedures performed today:    None.  Independent interpretation of notes and tests performed by another provider:   None.  Brief History, Exam, Impression, and Recommendations:    BP 127/88 (BP Location: Right Arm, Patient Position: Sitting, Cuff Size: Normal)   Pulse (!) 104   Ht 5\' 8"  (1.727 m)   Wt 199 lb 3.2 oz (90.4 kg)   SpO2 98%   BMI 30.29 kg/m   Primary hypertension Assessment & Plan: Blood pressure borderline on initial reading in office today, did improve some on recheck.  Patient continues with lisinopril.  No issues currently with chest pain, headaches, lightheadedness or dizziness.  Does report that blood pressure measured at home is somewhat similar with systolic in the 120s and diastolic in the 70s to 80s. At this time, will not make any changes to medication regimen.  Recommend intermittent monitoring of blood pressure at home, DASH diet.   Wellness examination -     CBC with Differential/Platelet; Future -     Comprehensive metabolic panel; Future -     Hemoglobin A1c; Future -     Lipid panel; Future -     TSH Rfx on Abnormal to Free T4; Future  Type 2 diabetes mellitus with hyperglycemia, without long-term current use of insulin [facility]) Assessment & Plan: Patient continues to follow with his endocrinologist, Dr. Roanna Raider.  Last visit was yesterday, hemoglobin A1c was checked but patient is unsure of number.  He continues with Rybelsus, has titrated dose with his endocrinologist and reports to be doing well with this.  No new issues such as polyuria or polydipsia. Can continue with current pharmacotherapy regimen, we will plan to request records from his endocrinology office.  Will also look to review prior labs and update our system with recent lab results.  We will check urine microalbumin/creatinine ratio in our office today  Orders: -     Microalbumin / creatinine urine ratio  Return in about 6 months (around 01/20/2024) for CPE with fasting labs 1 week  prior.   ___________________________________________ Cleopha Indelicato [place], MD, ABFM, CAQSM Primary Care and Sports Medicine [facility]

## 2023-07-23 NOTE — Assessment & Plan Note (Signed)
Patient continues to follow with his endocrinologist, Dr. Roanna Raider.  Last visit was yesterday, hemoglobin A1c was checked but patient is unsure of number.  He continues with Rybelsus, has titrated dose with his endocrinologist and reports to be doing well with this.  No new issues such as polyuria or polydipsia. Can continue with current pharmacotherapy regimen, we will plan to request records from his endocrinology office.  Will also look to review prior labs and update our system with recent lab results.  We will check urine microalbumin/creatinine ratio in our office today

## 2023-07-24 LAB — MICROALBUMIN / CREATININE URINE RATIO
Creatinine, Urine: 173.9 mg/dL
Microalb/Creat Ratio: 3 mg/g{creat} (ref 0–29)
Microalbumin, Urine: 5 ug/mL

## 2023-07-25 ENCOUNTER — Encounter (HOSPITAL_BASED_OUTPATIENT_CLINIC_OR_DEPARTMENT_OTHER): Payer: Self-pay | Admitting: Family Medicine

## 2023-09-18 ENCOUNTER — Other Ambulatory Visit: Payer: Self-pay

## 2023-09-18 ENCOUNTER — Other Ambulatory Visit (HOSPITAL_COMMUNITY): Payer: Self-pay

## 2023-09-19 ENCOUNTER — Other Ambulatory Visit (HOSPITAL_COMMUNITY): Payer: Self-pay

## 2023-10-01 NOTE — Progress Notes (Signed)
CANCELED

## 2023-10-03 ENCOUNTER — Other Ambulatory Visit (HOSPITAL_COMMUNITY): Payer: Self-pay

## 2023-10-04 ENCOUNTER — Other Ambulatory Visit (HOSPITAL_COMMUNITY): Payer: Self-pay

## 2023-10-04 ENCOUNTER — Other Ambulatory Visit: Payer: Self-pay

## 2023-10-04 MED ORDER — RYBELSUS 7 MG PO TABS
7.0000 mg | ORAL_TABLET | Freq: Every day | ORAL | 3 refills | Status: DC
  Filled 2023-10-04: qty 90, 90d supply, fill #0

## 2023-10-16 ENCOUNTER — Other Ambulatory Visit (HOSPITAL_COMMUNITY): Payer: Self-pay

## 2023-10-16 ENCOUNTER — Other Ambulatory Visit: Payer: Self-pay

## 2023-10-16 ENCOUNTER — Other Ambulatory Visit (HOSPITAL_BASED_OUTPATIENT_CLINIC_OR_DEPARTMENT_OTHER): Payer: Self-pay | Admitting: Family Medicine

## 2023-10-16 MED ORDER — GLIPIZIDE ER 5 MG PO TB24
5.0000 mg | ORAL_TABLET | Freq: Every day | ORAL | 0 refills | Status: DC
  Filled 2023-10-16 – 2023-11-04 (×3): qty 90, 90d supply, fill #0

## 2023-10-17 ENCOUNTER — Other Ambulatory Visit: Payer: Self-pay

## 2023-10-17 ENCOUNTER — Encounter: Payer: Self-pay | Admitting: Pharmacist

## 2023-10-22 ENCOUNTER — Other Ambulatory Visit: Payer: Self-pay

## 2023-10-30 ENCOUNTER — Emergency Department (HOSPITAL_BASED_OUTPATIENT_CLINIC_OR_DEPARTMENT_OTHER): Payer: Managed Care, Other (non HMO)

## 2023-10-30 ENCOUNTER — Other Ambulatory Visit (HOSPITAL_BASED_OUTPATIENT_CLINIC_OR_DEPARTMENT_OTHER): Payer: Self-pay

## 2023-10-30 ENCOUNTER — Emergency Department (HOSPITAL_BASED_OUTPATIENT_CLINIC_OR_DEPARTMENT_OTHER)
Admission: EM | Admit: 2023-10-30 | Discharge: 2023-10-30 | Disposition: A | Discharge status: Home / Self Care | Payer: Managed Care, Other (non HMO) | Attending: Emergency Medicine | Admitting: Emergency Medicine

## 2023-10-30 ENCOUNTER — Other Ambulatory Visit: Payer: Self-pay

## 2023-10-30 ENCOUNTER — Encounter (HOSPITAL_BASED_OUTPATIENT_CLINIC_OR_DEPARTMENT_OTHER): Payer: Self-pay | Admitting: Emergency Medicine

## 2023-10-30 DIAGNOSIS — E119 Type 2 diabetes mellitus without complications: Secondary | ICD-10-CM | POA: Insufficient documentation

## 2023-10-30 DIAGNOSIS — J101 Influenza due to other identified influenza virus with other respiratory manifestations: Secondary | ICD-10-CM | POA: Insufficient documentation

## 2023-10-30 DIAGNOSIS — R5383 Other fatigue: Secondary | ICD-10-CM | POA: Diagnosis present

## 2023-10-30 LAB — BASIC METABOLIC PANEL
Anion gap: 10 (ref 5–15)
BUN: 15 mg/dL (ref 6–20)
CO2: 25 mmol/L (ref 22–32)
Calcium: 9.5 mg/dL (ref 8.9–10.3)
Chloride: 102 mmol/L (ref 98–111)
Creatinine, Ser: 1.08 mg/dL (ref 0.61–1.24)
GFR, Estimated: >60 mL/min (ref >60)
Glucose, Bld: 143 mg/dL — ABNORMAL HIGH (ref 70–99)
Potassium: 4.1 mmol/L (ref 3.5–5.1)
Sodium: 137 mmol/L (ref 135–145)

## 2023-10-30 LAB — CBC WITH DIFFERENTIAL/PLATELET
Abs Immature Granulocytes: 0.02 10*3/uL (ref 0.00–0.07)
Basophils Absolute: 0 10*3/uL (ref 0.0–0.1)
Basophils Relative: 1 %
Eosinophils Absolute: 0 10*3/uL (ref 0.0–0.5)
Eosinophils Relative: 0 %
HCT: 42.1 % (ref 39.0–52.0)
Hemoglobin: 12.7 g/dL — ABNORMAL LOW (ref 13.0–17.0)
Immature Granulocytes: 0 %
Lymphocytes Relative: 14 %
Lymphs Abs: 0.8 10*3/uL (ref 0.7–4.0)
MCH: 20.8 pg — ABNORMAL LOW (ref 26.0–34.0)
MCHC: 30.2 g/dL (ref 30.0–36.0)
MCV: 68.8 fL — ABNORMAL LOW (ref 80.0–100.0)
Monocytes Absolute: 0.7 10*3/uL (ref 0.1–1.0)
Monocytes Relative: 13 %
Neutro Abs: 4.1 10*3/uL (ref 1.7–7.7)
Neutrophils Relative %: 72 %
Platelets: 204 10*3/uL (ref 150–400)
RBC: 6.12 MIL/uL — ABNORMAL HIGH (ref 4.22–5.81)
RDW: 15.7 % — ABNORMAL HIGH (ref 11.5–15.5)
WBC: 5.7 10*3/uL (ref 4.0–10.5)
nRBC: 0 % (ref 0.0–0.2)

## 2023-10-30 LAB — RESP PANEL BY RT-PCR (RSV, FLU A&B, COVID)  RVPGX2
Influenza A by PCR: POSITIVE — AB
Influenza B by PCR: NEGATIVE
Resp Syncytial Virus by PCR: NEGATIVE
SARS Coronavirus 2 by RT PCR: NEGATIVE

## 2023-10-30 LAB — CBG MONITORING, ED: Glucose-Capillary: 158 mg/dL — ABNORMAL HIGH (ref 70–99)

## 2023-10-30 MED ORDER — IBUPROFEN 800 MG PO TABS
800.0000 mg | ORAL_TABLET | Freq: Three times a day (TID) | ORAL | 0 refills | Status: DC
  Filled 2023-10-30: qty 21, 7d supply, fill #0

## 2023-10-30 MED ORDER — ONDANSETRON 4 MG PO TBDP
4.0000 mg | ORAL_TABLET | ORAL | 0 refills | Status: DC | PRN
  Filled 2023-10-30: qty 20, 4d supply, fill #0

## 2023-10-30 MED ORDER — SODIUM CHLORIDE 0.9 % IV BOLUS
1000.0000 mL | Freq: Once | INTRAVENOUS | Status: AC
  Administered 2023-10-30: 1000 mL via INTRAVENOUS

## 2023-10-30 MED ORDER — IBUPROFEN 800 MG PO TABS
800.0000 mg | ORAL_TABLET | Freq: Once | ORAL | Status: AC
  Administered 2023-10-30: 800 mg via ORAL
  Filled 2023-10-30: qty 1

## 2023-10-30 MED ORDER — ACETAMINOPHEN 500 MG PO TABS
1000.0000 mg | ORAL_TABLET | Freq: Once | ORAL | Status: AC
  Administered 2023-10-30: 1000 mg via ORAL
  Filled 2023-10-30: qty 2

## 2023-10-30 MED ORDER — OSELTAMIVIR PHOSPHATE 75 MG PO CAPS
75.0000 mg | ORAL_CAPSULE | Freq: Two times a day (BID) | ORAL | 0 refills | Status: DC
  Filled 2023-10-30: qty 10, 5d supply, fill #0

## 2023-10-30 MED ORDER — OSELTAMIVIR PHOSPHATE 75 MG PO CAPS
75.0000 mg | ORAL_CAPSULE | Freq: Once | ORAL | Status: AC
  Administered 2023-10-30: 75 mg via ORAL
  Filled 2023-10-30: qty 1

## 2023-10-30 MED ORDER — LACTATED RINGERS IV BOLUS
1000.0000 mL | Freq: Once | INTRAVENOUS | Status: AC
  Administered 2023-10-30: 1000 mL via INTRAVENOUS

## 2023-10-30 NOTE — Discharge Instructions (Signed)
 1.  Rest for the next 1 to 2 days.  Take ibuprofen 800 mg every 8 hours to control fever and bodyaches.  May add extra strength Tylenol per package instructions if needed for additional control of fever or body ache.  Take Zofran as prescribed if needed for nausea or vomiting.  Take Tamiflu as prescribed.  Take your first dose tonight at bedtime. 2.  Try to stay well-hydrated.  Keep fluids at your bedside and sip small volumes. 3.  You may return to work or school after 24 hours of fever free. Schedule a follow-up appointment with your doctor for recheck in 3 to 4 days.  Return to the emergency department if you are developing shortness of breath, chest pain, cannot stay hydrated to feel increasingly weak, confused, get vomiting and cannot control with nausea medication or other concerning changes.

## 2023-10-30 NOTE — ED Triage Notes (Signed)
 Chills, weakness, fast heart rate since yesterday.  Blood sugars 130-160.  Pt took ibuprofen at home around 1 am.

## 2023-10-30 NOTE — ED Notes (Signed)
 Patient transported to X-ray

## 2023-10-30 NOTE — ED Provider Notes (Signed)
 [place] [facility] Provider Note   CSN: [id number] Arrival date & time: 10/30/23  0746     History  Chief Complaint  Patient presents with   URI    Adrian Reed is a 43 y.o. male.  HPI Patient reports he was at a work conference yesterday.  He was feeling all right but toward the end of the day felt a little bit fatigued and achy.  By the early morning hours, he awakened and felt very ill with chills general weakness and fast heart rate.  He tried some ibuprofen at about 1 AM.  Reports he continued to feel weak, shaky, nauseated.  No significant cough.  No shortness of breath.  No chest pain.  No abdominal pain.  Patient is type II diabetic.  His blood sugars have been running 130s to 160s at home.    Home Medications Prior to Admission medications   Medication Sig Start Date End Date Taking? Authorizing Provider  ibuprofen (ADVIL) 800 MG tablet Take 1 tablet (800 mg total) by mouth 3 (three) times daily. 10/30/23  Yes Arby Barrette, MD  ondansetron (ZOFRAN-ODT) 4 MG disintegrating tablet Take 1 tablet (4 mg total) by mouth every 4 (four) hours as needed. 10/30/23  Yes Arby Barrette, MD  oseltamivir (TAMIFLU) 75 MG capsule Take 1 capsule (75 mg total) by mouth every 12 (twelve) hours. 10/30/23  Yes Arby Barrette, MD  acarbose (PRECOSE) 100 MG tablet Take 1 tablet (100 mg total) by mouth 3 (three) times daily with meals. 05/28/23   de [place], Raymond J, MD  atorvastatin (LIPITOR) 80 MG tablet Take 1 tablet (80 mg total) by mouth daily. 03/19/23   de [place], Buren Kos, MD  Blood Glucose Monitoring Suppl (CONTOUR NEXT MONITOR) w/Device KIT Use to check blood sugars once daily    [provider]  glipiZIDE (GLIPIZIDE XL) 5 MG 24 hr tablet Take 1 tablet (5 mg total) by mouth daily with breakfast. 10/16/23   de [place], Buren Kos, MD  glucose blood (CONTOUR NEXT TEST) test strip Use to check FSBG In Vitro once daily    [provider]   lisinopril (ZESTRIL) 10 MG tablet Take 1 tablet (10 mg total) by mouth daily. 03/19/23   de [place], Buren Kos, MD  RYBELSUS 7 MG TABS Take 1 tablet (7 mg) by mouth daily - at least 30 minutes before first meal, beverage or other oral medicine of the day 10/04/23         Allergies    Metformin hcl    Review of Systems   Review of Systems  Physical Exam Updated Vital Signs BP 130/83   Pulse (!) 106   Temp 99.3 F (37.4 C) (Oral)   Resp 20   Ht 5\' 8"  (1.727 m)   Wt 88.5 kg   SpO2 97%   BMI 29.65 kg/m  Physical Exam Constitutional:      Comments: Patient is alert nontoxic.  Well-nourished well-developed.  No respiratory distress.  HENT:     Head: Normocephalic and atraumatic.     Mouth/Throat:     Mouth: Mucous membranes are moist.     Pharynx: Oropharynx is clear.  Eyes:     Extraocular Movements: Extraocular movements intact.  Cardiovascular:     Rate and Rhythm: Regular rhythm. Tachycardia present.  Pulmonary:     Effort: Pulmonary effort is normal.     Breath sounds: Normal breath sounds.  Abdominal:     General: There is  no distension.     Palpations: Abdomen is soft.     Tenderness: There is no abdominal tenderness. There is no guarding.  Musculoskeletal:        General: No swelling or tenderness. Normal range of motion.  Skin:    General: Skin is warm and dry.  Neurological:     General: No focal deficit present.     Mental Status: He is oriented to person, place, and time.     Motor: No weakness.     Coordination: Coordination normal.  Psychiatric:        Mood and Affect: Mood normal.     ED Results / Procedures / Treatments   Labs (all labs ordered are listed, but only abnormal results are displayed) Labs Reviewed  RESP PANEL BY RT-PCR (RSV, FLU A&B, COVID)  RVPGX2 - Abnormal; Notable for the following components:      Result Value   Influenza A by PCR POSITIVE (*)    All other components within normal limits  CBC WITH DIFFERENTIAL/PLATELET -  Abnormal; Notable for the following components:   RBC 6.12 (*)    Hemoglobin 12.7 (*)    MCV 68.8 (*)    MCH 20.8 (*)    RDW 15.7 (*)    All other components within normal limits  BASIC METABOLIC PANEL - Abnormal; Notable for the following components:   Glucose, Bld 143 (*)    All other components within normal limits  CBG MONITORING, ED - Abnormal; Notable for the following components:   Glucose-Capillary 158 (*)    All other components within normal limits    EKG EKG Interpretation Date/Time:  Wednesday October 30 2023 07:58:32 EST Ventricular Rate:  127 PR Interval:  114 QRS Duration:  83 QT Interval:  272 QTC Calculation: 396 R Axis:   74  Text Interpretation: Sinus tachycardia rate related ST depression and lateral T wave inversion. no old comparison Confirmed by Arby Barrette [phone]) on 10/30/2023 9:38:11 AM  Radiology DG Chest 2 View Result Date: 10/30/2023 CLINICAL DATA:  Cough. EXAM: CHEST - 2 VIEW COMPARISON:  None Available. FINDINGS: The heart size and mediastinal contours are within normal limits. No focal consolidation, pleural effusion, or pneumothorax. No acute osseous abnormality. IMPRESSION: No acute cardiopulmonary findings. Electronically Signed   By: Hart Robinsons M.D.   On: 10/30/2023 08:37    Procedures Procedures    Medications Ordered in ED Medications  sodium chloride 0.9 % bolus 1,000 mL (0 mLs Intravenous Stopped 10/30/23 1012)  ibuprofen (ADVIL) tablet 800 mg (800 mg Oral Given 10/30/23 0917)  acetaminophen (TYLENOL) tablet 1,000 mg (1,000 mg Oral Given 10/30/23 0917)  oseltamivir (TAMIFLU) capsule 75 mg (75 mg Oral Given 10/30/23 1028)  lactated ringers bolus 1,000 mL (0 mLs Intravenous Stopped 10/30/23 1133)    ED Course/ Medical Decision Making/ A&P                                 Medical Decision Making Amount and/or Complexity of Data Reviewed Labs: ordered. Radiology: ordered.  Risk OTC drugs. Prescription drug  management.   Patient presents as outlined.  He had fairly rapid onset of symptoms of chills body aches and tachycardia.  Patient is a controlled type II diabetic.  Will proceed with lab work and respiratory panel.  He does have tachycardia to 120.  Will initiate fluid resuscitation.  No chest pain or shortness of breath.  Lower suspicion for  ACS in the setting of acute onset of what appears more likely infectious illness.  Respiratory panel returns with flu a positive.  Count 5.7.  Hemoglobin 12.7.  Normal differential.  Metabolic panel normal with potassium of 4.1.  Glucose 143.  Normal GFR.  Anion gap of 10.  Chest x-ray interpreted by radiology no acute findings.  With tachycardia and influenza in setting of diabetes will opt for rehydration and initiation of Tamiflu.  Patient has had ibuprofen and Tylenol.  2 L of volume resuscitation.  Heart rate has trended down to 106 at rest.  Blood pressures are stable and 130s over 90s.  Respiratory status is stable with no dyspnea or hypoxia.  At this time I do feel he is clinically stable for continued home management.  We discussed the plan of initiate Tamiflu, good oral hydration, fever control with ibuprofen Tylenol and rest for the next several days.  We have extensively reviewed return precautions.  Patient voices understanding.        Final Clinical Impression(s) / ED Diagnoses Final diagnoses:  Influenza A    Rx / DC Orders ED Discharge Orders          Ordered    oseltamivir (TAMIFLU) 75 MG capsule  Every 12 hours        10/30/23 1205    ibuprofen (ADVIL) 800 MG tablet  3 times daily        10/30/23 1205    ondansetron (ZOFRAN-ODT) 4 MG disintegrating tablet  Every 4 hours PRN        10/30/23 1205              Arby Barrette, MD 10/30/23 1215

## 2023-11-04 ENCOUNTER — Ambulatory Visit (HOSPITAL_BASED_OUTPATIENT_CLINIC_OR_DEPARTMENT_OTHER): Admitting: Family Medicine

## 2023-11-04 ENCOUNTER — Encounter (HOSPITAL_BASED_OUTPATIENT_CLINIC_OR_DEPARTMENT_OTHER): Payer: Self-pay | Admitting: Family Medicine

## 2023-11-04 ENCOUNTER — Other Ambulatory Visit (HOSPITAL_COMMUNITY): Payer: Self-pay

## 2023-11-04 VITALS — BP 126/86 | HR 87 | Ht 68.0 in | Wt 196.2 lb

## 2023-11-04 DIAGNOSIS — J111 Influenza due to unidentified influenza virus with other respiratory manifestations: Secondary | ICD-10-CM | POA: Insufficient documentation

## 2023-11-04 DIAGNOSIS — Z0000 Encounter for general adult medical examination without abnormal findings: Secondary | ICD-10-CM | POA: Diagnosis not present

## 2023-11-04 NOTE — Progress Notes (Signed)
    Procedures performed today:    None.  Independent interpretation of notes and tests performed by another provider:   None.  Brief History, Exam, Impression, and Recommendations:    BP 126/86 (BP Location: Right Arm, Patient Position: Sitting, Cuff Size: Normal)   Pulse 87   Ht 5\' 8"  (1.727 m)   Wt 196 lb 3.2 oz (89 kg)   SpO2 98%   BMI 29.83 kg/m   Influenza Assessment & Plan: Patient was recently seen in the emergency department.  2 acute illness.  He had bodyaches, fevers, presented to ED and ultimately tested positive for influenza.  He was started on Tamiflu.  He has questions today regarding recent illness, concerns related to infectivity and possible exposure to others.  He generally has been doing well, notes that he has had gradual symptom improvement.  No fevers for at least 2 days.  He has not had any shortness of breath or trouble breathing. On exam, patient is in no acute distress, vital signs stable.  Cardiovascular exam with regular rate and rhythm, lungs clear to auscultation bilaterally. We discussed considerations related to recent diagnosis of influenza.  We discussed typically expected timeframe for increased viral shedding and risk of exposing others.  Given duration since symptom onset, resolution of fevers, gradual improvement in symptoms, feel that he would be at low risk for exposing others to influenza.   Wellness examination -     CBC with Differential/Platelet; Future -     Hemoglobin A1c; Future -     Comprehensive metabolic panel; Future -     Lipid panel; Future -     TSH Rfx on Abnormal to Free T4; Future  Return if symptoms worsen or fail to improve.   ___________________________________________ Adrian Biel de [place], MD, ABFM, CAQSM Primary Care and Sports Medicine [facility]

## 2023-11-04 NOTE — Assessment & Plan Note (Signed)
 Patient was recently seen in the emergency department.  2 acute illness.  He had bodyaches, fevers, presented to ED and ultimately tested positive for influenza.  He was started on Tamiflu.  He has questions today regarding recent illness, concerns related to infectivity and possible exposure to others.  He generally has been doing well, notes that he has had gradual symptom improvement.  No fevers for at least 2 days.  He has not had any shortness of breath or trouble breathing. On exam, patient is in no acute distress, vital signs stable.  Cardiovascular exam with regular rate and rhythm, lungs clear to auscultation bilaterally. We discussed considerations related to recent diagnosis of influenza.  We discussed typically expected timeframe for increased viral shedding and risk of exposing others.  Given duration since symptom onset, resolution of fevers, gradual improvement in symptoms, feel that he would be at low risk for exposing others to influenza.

## 2023-11-04 NOTE — Patient Instructions (Signed)

## 2023-12-25 ENCOUNTER — Other Ambulatory Visit (HOSPITAL_COMMUNITY): Payer: Self-pay

## 2023-12-25 ENCOUNTER — Other Ambulatory Visit (HOSPITAL_BASED_OUTPATIENT_CLINIC_OR_DEPARTMENT_OTHER): Payer: Self-pay | Admitting: Family Medicine

## 2023-12-25 ENCOUNTER — Other Ambulatory Visit: Payer: Self-pay

## 2023-12-25 MED ORDER — ACARBOSE 100 MG PO TABS
100.0000 mg | ORAL_TABLET | Freq: Three times a day (TID) | ORAL | 1 refills | Status: DC
  Filled 2023-12-25: qty 270, 90d supply, fill #0
  Filled 2024-03-19: qty 270, 90d supply, fill #1

## 2023-12-25 MED ORDER — ATORVASTATIN CALCIUM 80 MG PO TABS
80.0000 mg | ORAL_TABLET | Freq: Every day | ORAL | 2 refills | Status: AC
  Filled 2023-12-25: qty 90, 90d supply, fill #0
  Filled 2024-03-19: qty 90, 90d supply, fill #1
  Filled 2024-08-31: qty 90, 90d supply, fill #2

## 2023-12-25 MED ORDER — LISINOPRIL 10 MG PO TABS
10.0000 mg | ORAL_TABLET | Freq: Every day | ORAL | 2 refills | Status: DC
  Filled 2023-12-25: qty 90, 90d supply, fill #0
  Filled 2024-03-19: qty 90, 90d supply, fill #1
  Filled 2024-06-23: qty 90, 90d supply, fill #2

## 2023-12-26 ENCOUNTER — Other Ambulatory Visit: Payer: Self-pay

## 2024-01-06 ENCOUNTER — Other Ambulatory Visit: Payer: Self-pay

## 2024-01-13 ENCOUNTER — Other Ambulatory Visit (HOSPITAL_BASED_OUTPATIENT_CLINIC_OR_DEPARTMENT_OTHER): Payer: Managed Care, Other (non HMO)

## 2024-01-13 DIAGNOSIS — Z0000 Encounter for general adult medical examination without abnormal findings: Secondary | ICD-10-CM

## 2024-01-14 ENCOUNTER — Ambulatory Visit (HOSPITAL_BASED_OUTPATIENT_CLINIC_OR_DEPARTMENT_OTHER): Payer: Self-pay | Admitting: Family Medicine

## 2024-01-14 LAB — CBC WITH DIFFERENTIAL/PLATELET
Basophils Absolute: 0.1 10*3/uL (ref 0.0–0.2)
Basos: 1 %
EOS (ABSOLUTE): 0.2 10*3/uL (ref 0.0–0.4)
Eos: 3 %
Hematocrit: 42.6 % (ref 37.5–51.0)
Hemoglobin: 13 g/dL (ref 13.0–17.7)
Immature Grans (Abs): 0 10*3/uL (ref 0.0–0.1)
Immature Granulocytes: 1 %
Lymphocytes Absolute: 2.8 10*3/uL (ref 0.7–3.1)
Lymphs: 42 %
MCH: 21.1 pg — ABNORMAL LOW (ref 26.6–33.0)
MCHC: 30.5 g/dL — ABNORMAL LOW (ref 31.5–35.7)
MCV: 69 fL — ABNORMAL LOW (ref 79–97)
Monocytes Absolute: 0.5 10*3/uL (ref 0.1–0.9)
Monocytes: 8 %
Neutrophils Absolute: 3 10*3/uL (ref 1.4–7.0)
Neutrophils: 45 %
Platelets: 279 10*3/uL (ref 150–450)
RBC: 6.16 x10E6/uL — ABNORMAL HIGH (ref 4.14–5.80)
RDW: 17.1 % — ABNORMAL HIGH (ref 11.6–15.4)
WBC: 6.5 10*3/uL (ref 3.4–10.8)

## 2024-01-14 LAB — COMPREHENSIVE METABOLIC PANEL WITH GFR
ALT: 49 IU/L — ABNORMAL HIGH (ref 0–44)
AST: 28 IU/L (ref 0–40)
Albumin: 4.9 g/dL (ref 4.1–5.1)
Alkaline Phosphatase: 82 IU/L (ref 44–121)
BUN/Creatinine Ratio: 16 (ref 9–20)
BUN: 16 mg/dL (ref 6–24)
Bilirubin Total: 0.3 mg/dL (ref 0.0–1.2)
CO2: 21 mmol/L (ref 20–29)
Calcium: 10 mg/dL (ref 8.7–10.2)
Chloride: 98 mmol/L (ref 96–106)
Creatinine, Ser: 1.03 mg/dL (ref 0.76–1.27)
Globulin, Total: 3 g/dL (ref 1.5–4.5)
Glucose: 167 mg/dL — ABNORMAL HIGH (ref 70–99)
Potassium: 4.7 mmol/L (ref 3.5–5.2)
Sodium: 137 mmol/L (ref 134–144)
Total Protein: 7.9 g/dL (ref 6.0–8.5)
eGFR: 94 mL/min/{1.73_m2} (ref >59)

## 2024-01-14 LAB — LIPID PANEL
Chol/HDL Ratio: 3.9 ratio (ref 0.0–5.0)
Cholesterol, Total: 148 mg/dL (ref 100–199)
HDL: 38 mg/dL — ABNORMAL LOW (ref >39)
LDL Chol Calc (NIH): 70 mg/dL (ref 0–99)
Triglycerides: 242 mg/dL — ABNORMAL HIGH (ref 0–149)
VLDL Cholesterol Cal: 40 mg/dL (ref 5–40)

## 2024-01-14 LAB — HEMOGLOBIN A1C
Est. average glucose Bld gHb Est-mCnc: 180 mg/dL
Hgb A1c MFr Bld: 7.9 % — ABNORMAL HIGH (ref 4.8–5.6)

## 2024-01-14 LAB — TSH RFX ON ABNORMAL TO FREE T4: TSH: 2.78 u[IU]/mL (ref 0.450–4.500)

## 2024-01-20 ENCOUNTER — Encounter (HOSPITAL_BASED_OUTPATIENT_CLINIC_OR_DEPARTMENT_OTHER): Payer: Managed Care, Other (non HMO) | Admitting: Family Medicine

## 2024-01-28 ENCOUNTER — Other Ambulatory Visit (HOSPITAL_BASED_OUTPATIENT_CLINIC_OR_DEPARTMENT_OTHER): Payer: Self-pay | Admitting: Family Medicine

## 2024-01-28 MED ORDER — GLIPIZIDE ER 5 MG PO TB24
5.0000 mg | ORAL_TABLET | Freq: Every day | ORAL | 3 refills | Status: DC
  Filled 2024-01-28: qty 90, 90d supply, fill #0
  Filled 2024-04-23: qty 90, 90d supply, fill #1

## 2024-01-29 ENCOUNTER — Other Ambulatory Visit (HOSPITAL_COMMUNITY): Payer: Self-pay

## 2024-01-29 ENCOUNTER — Ambulatory Visit (INDEPENDENT_AMBULATORY_CARE_PROVIDER_SITE_OTHER): Admitting: Family Medicine

## 2024-01-29 ENCOUNTER — Encounter (HOSPITAL_BASED_OUTPATIENT_CLINIC_OR_DEPARTMENT_OTHER): Payer: Self-pay | Admitting: Family Medicine

## 2024-01-29 ENCOUNTER — Other Ambulatory Visit: Payer: Self-pay

## 2024-01-29 VITALS — BP 130/97 | HR 88 | Ht 68.0 in | Wt 200.9 lb

## 2024-01-29 DIAGNOSIS — M542 Cervicalgia: Secondary | ICD-10-CM | POA: Diagnosis not present

## 2024-01-29 DIAGNOSIS — Z0000 Encounter for general adult medical examination without abnormal findings: Secondary | ICD-10-CM

## 2024-01-29 NOTE — Patient Instructions (Signed)

## 2024-01-29 NOTE — Progress Notes (Signed)
 Subjective:    CC: Annual Physical Exam  HPI:  Adrian Reed is a 43 y.o. presenting for annual physical  I reviewed the past medical history, family history, social history, surgical history, and allergies today and no changes were needed.  Please see the problem list section below in epic for further details.  Past Medical History: Past Medical History:  Diagnosis Date   Anxiety    Diabetes mellitus, type II (HCC)    HTN (hypertension)    Hyperlipidemia    Past Surgical History: History reviewed. No pertinent surgical history. Social History: Social History   Socioeconomic History   Marital status: Married    Spouse name: Not on file   Number of children: Not on file   Years of education: Not on file   Highest education level: Bachelor's degree (e.g., BA, AB, BS)  Occupational History   Not on file  Tobacco Use   Smoking status: Former    Current packs/day: 0.00    Types: Cigarettes    Quit date: 09/04/2003    Years since quitting: 20.4   Smokeless tobacco: Not on file  Substance and Sexual Activity   Alcohol use: Not Currently   Drug use: No   Sexual activity: Yes    Birth control/protection: None  Other Topics Concern   Not on file  Social History Narrative   Not on file   Social Drivers of Health   Financial Resource Strain: Medium Risk (01/28/2024)   Overall Financial Resource Strain (CARDIA)    Difficulty of Paying Living Expenses: Somewhat hard  Food Insecurity: No Food Insecurity (01/28/2024)   Hunger Vital Sign    Worried About Running Out of Food in the Last Year: Never true    Ran Out of Food in the Last Year: Never true  Transportation Needs: No Transportation Needs (01/28/2024)   PRAPARE - Administrator, Civil Service (Medical): No    Lack of Transportation (Non-Medical): No  Physical Activity: Insufficiently Active (01/28/2024)   Exercise Vital Sign    Days of Exercise per Week: 1 day    Minutes of Exercise per Session: 30  min  Stress: No Stress Concern Present (01/28/2024)   [employer] of Occupational Health - Occupational Stress Questionnaire    Feeling of Stress : Only a little  Social Connections: Unknown (01/28/2024)   Social Connection and Isolation Panel [NHANES]    Frequency of Communication with Friends and Family: Three times a week    Frequency of Social Gatherings with Friends and Family: Once a week    Attends Religious Services: Patient declined    Database administrator or Organizations: Patient declined    Attends Engineer, structural: Not on file    Marital Status: Married   Family History: Family History  Problem Relation Age of Onset   Diabetes Sister    Allergies: Allergies  Allergen Reactions   Metformin Hcl Diarrhea    Stomach pain   Medications: See med rec.  Review of Systems: No headache, visual changes, nausea, vomiting, diarrhea, constipation, dizziness, abdominal pain, skin rash, fevers, chills, night sweats, swollen lymph nodes, weight loss, chest pain, body aches, joint swelling, muscle aches, shortness of breath, mood changes, visual or auditory hallucinations.  Objective:    BP (!) 130/97 (BP Location: Left Arm, Patient Position: Sitting, Cuff Size: Normal)   Pulse 88   Ht 5\' 8"  (1.727 m)   Wt 200 lb 14.4 oz (91.1 kg)   SpO2 100%  BMI 30.55 kg/m   General: Well Developed, well nourished, and in no acute distress. Neuro: Alert and oriented x3, extra-ocular muscles intact, sensation grossly intact. Cranial nerves II through XII are intact, motor, sensory, and coordinative functions are all intact. HEENT: Normocephalic, atraumatic, pupils equal round reactive to light, neck supple, no masses, no lymphadenopathy, thyroid nonpalpable. Oropharynx, nasopharynx, external ear canals are unremarkable. Skin: Warm and dry, no rashes noted. Cardiac: Regular rate and rhythm, no murmurs rubs or gallops. Respiratory: Clear to auscultation bilaterally. Not using  accessory muscles, speaking in full sentences. Abdominal: Soft, nontender, nondistended, positive bowel sounds, no masses, no organomegaly. Musculoskeletal: Shoulder, elbow, wrist, hip, knee, ankle stable, and with full range of motion.  Impression and Recommendations:    Wellness examination Assessment & Plan: Routine HCM labs reviewed. HCM reviewed/discussed. Anticipatory guidance regarding healthy weight, lifestyle and choices given. Recommend healthy diet.  Recommend approximately 150 minutes/week of moderate intensity exercise Recommend regular dental and vision exams Always use seatbelt/lap and shoulder restraints Recommend using smoke alarms and checking batteries at least twice a year Recommend using sunscreen when outside Discussed immunization recommendations   Neck pain Assessment & Plan: Started a few days ago, he thinks that he was caring his child in carrier for a while and this may have elicited his current pain.  He has been utilizing conservative measures Mounjaro.  Pain primarily along the left side of neck and into left trapezius.  Does not have any radiation into upper extremities.  No numbness or tingling. On exam, does have some tenderness to palpation through left cervical paraspinal muscles and into left trapezius.  Largely normal range of motion of neck in office today. Discussed conservative measures, recommend utilizing topical therapies, can utilize OTC medications as well to help with pain control.  Discussed possible referral to physical therapy.  He would like to see how the next couple weeks ago in regards to conservative management and then consider referral at that time.  He will send us  a message if he would like to proceed with this   Recent labs with elevated A1c at 7.9%.  Goal for patient is less than 7%.  He does follow with endocrinology.  We discussed considerations related to A1c and recommendations today.  We also completed foot exam today,  documented in chart  Return in about 6 months (around 07/31/2024) for hypertension, diabetes.   ___________________________________________ Nelissa Bolduc de [place], MD, ABFM, CAQSM Primary Care and Sports Medicine [facility]

## 2024-01-29 NOTE — Assessment & Plan Note (Signed)
 Started a few days ago, he thinks that he was caring his child in carrier for a while and this may have elicited his current pain.  He has been utilizing conservative measures Mounjaro.  Pain primarily along the left side of neck and into left trapezius.  Does not have any radiation into upper extremities.  No numbness or tingling. On exam, does have some tenderness to palpation through left cervical paraspinal muscles and into left trapezius.  Largely normal range of motion of neck in office today. Discussed conservative measures, recommend utilizing topical therapies, can utilize OTC medications as well to help with pain control.  Discussed possible referral to physical therapy.  He would like to see how the next couple weeks ago in regards to conservative management and then consider referral at that time.  He will send us  a message if he would like to proceed with this

## 2024-01-29 NOTE — Assessment & Plan Note (Signed)

## 2024-02-04 LAB — HM DIABETES EYE EXAM

## 2024-02-06 ENCOUNTER — Encounter (HOSPITAL_BASED_OUTPATIENT_CLINIC_OR_DEPARTMENT_OTHER): Payer: Self-pay | Admitting: *Deleted

## 2024-02-18 ENCOUNTER — Ambulatory Visit
Admission: EM | Admit: 2024-02-18 | Discharge: 2024-02-18 | Disposition: A | Discharge status: Home / Self Care | Attending: Internal Medicine | Admitting: Internal Medicine

## 2024-02-18 DIAGNOSIS — J029 Acute pharyngitis, unspecified: Secondary | ICD-10-CM

## 2024-02-18 LAB — POC SARS CORONAVIRUS 2 AG -  ED: SARS Coronavirus 2 Ag: NEGATIVE

## 2024-02-18 NOTE — ED Triage Notes (Addendum)
 Pt c/o sore throat that began this morning. States sore throat has gotten better and is just scratchy now

## 2024-02-18 NOTE — ED Provider Notes (Signed)
 [name]    CSN: [id number] Arrival date & time: 02/18/24  1303      History   Chief Complaint Chief Complaint  Patient presents with   Sore Throat    HPI Adrian Reed is a 43 y.o. male.   Patient presents to urgent care for evaluation of sore throat that started yesterday.  He states his throat pain has since improved, however throat remains scratchy.  Denies fatigue, nasal congestion, cough, dizziness, fever, chills, nausea, vomiting, diarrhea, rash, and difficulty maintaining secretions.  Her throat is currently tolerable.  He has not attempted use of any other medications for symptoms prior to arrival.  Denies recent sick contacts with similar symptoms.   Sore Throat    Past Medical History:  Diagnosis Date   Anxiety    Diabetes mellitus, type II (HCC)    HTN (hypertension)    Hyperlipidemia     Patient Active Problem List   Diagnosis Date Noted   Neck pain 01/29/2024   Influenza 11/04/2023   Wellness examination 01/14/2023   Microcytosis 01/14/2023   Diabetes mellitus (HCC) 06/12/2022   Primary hypertension 06/12/2022   Hyperlipidemia 06/12/2022   Vitamin D deficiency 10/04/2015   Headache 07/01/2015   Intractable migraine without aura and without status migrainosus 07/01/2015   Fatigue 01/17/2015   Type 2 diabetes mellitus with hyperglycemia (HCC) 01/29/2014    No past surgical history on file.     Home Medications    Prior to Admission medications   Medication Sig Start Date End Date Taking? Authorizing Provider  acarbose  (PRECOSE ) 100 MG tablet Take 1 tablet (100 mg total) by mouth 3 (three) times daily with meals. 12/25/23   [place], Alonza Jansky, MD  atorvastatin  (LIPITOR) 80 MG tablet Take 1 tablet (80 mg total) by mouth daily. 12/25/23   [place], Alonza Jansky, MD  Blood Glucose Monitoring Suppl (CONTOUR NEXT MONITOR) w/Device KIT Use to check blood sugars once daily    [provider]  glipiZIDE  (GLIPIZIDE  XL) 5 MG 24  hr tablet Take 1 tablet (5 mg total) by mouth daily with breakfast. 01/28/24   [place], Alonza Jansky, MD  glucose blood (CONTOUR NEXT TEST) test strip Use to check FSBG In Vitro once daily    [provider]  ibuprofen  (ADVIL ) 800 MG tablet Take 1 tablet (800 mg total) by mouth 3 (three) times daily. 10/30/23   Wynetta Heckle, MD  lisinopril  (ZESTRIL ) 10 MG tablet Take 1 tablet (10 mg total) by mouth daily. 12/25/23   [place], Raymond J, MD  RYBELSUS  14 MG TABS Take 1 tablet by mouth every morning. 01/24/24   [provider]    Family History Family History  Problem Relation Age of Onset   Diabetes Sister     Social History Social History   Tobacco Use   Smoking status: Former    Current packs/day: 0.00    Types: Cigarettes    Quit date: 09/04/2003    Years since quitting: 20.4  Substance Use Topics   Alcohol use: Not Currently   Drug use: No     Allergies   Metformin hcl   Review of Systems Review of Systems Per HPI  Physical Exam Triage Vital Signs ED Triage Vitals  Encounter Vitals Group     BP 02/18/24 1330 115/82     Girls Systolic BP Percentile --      Girls Diastolic BP Percentile --      Boys Systolic BP Percentile --  Boys Diastolic BP Percentile --      Pulse Rate 02/18/24 1330 (!) 114     Resp 02/18/24 1330 20     Temp 02/18/24 1330 98 F (36.7 C)     Temp Source 02/18/24 1330 Oral     SpO2 02/18/24 1330 98 %     Weight --      Height --      Head Circumference --      Peak Flow --      Pain Score 02/18/24 1331 3     Pain Loc --      Pain Education --      Exclude from Growth Chart --    No data found.  Updated Vital Signs BP 115/82 (BP Location: Right Arm)   Pulse (!) 114   Temp 98 F (36.7 C) (Oral)   Resp 20   SpO2 98%   Visual Acuity Right Eye Distance:   Left Eye Distance:   Bilateral Distance:    Right Eye Near:   Left Eye Near:    Bilateral Near:     Physical Exam Vitals and nursing note reviewed.   Constitutional:      Appearance: He is not ill-appearing or toxic-appearing.  HENT:     Head: Normocephalic and atraumatic.     Right Ear: Hearing, tympanic membrane, ear canal and external ear normal.     Left Ear: Hearing, tympanic membrane, ear canal and external ear normal.     Nose: Nose normal.     Mouth/Throat:     Lips: Pink.     Mouth: Mucous membranes are moist. No injury or oral lesions.     Dentition: Normal dentition.     Tongue: No lesions.     Pharynx: Oropharynx is clear. Uvula midline. Posterior oropharyngeal erythema present. No pharyngeal swelling, oropharyngeal exudate, uvula swelling or postnasal drip.     Tonsils: No tonsillar exudate.     Comments: Minimal erythema to the posterior oropharynx.  Maintaining secretions without difficulty.  Normal voice sounds.  Eyes:     General: Lids are normal. Vision grossly intact. Gaze aligned appropriately.     Extraocular Movements: Extraocular movements intact.     Conjunctiva/sclera: Conjunctivae normal.   Neck:     Trachea: Trachea and phonation normal.   Cardiovascular:     Rate and Rhythm: Normal rate and regular rhythm.     Heart sounds: Normal heart sounds, S1 normal and S2 normal.  Pulmonary:     Effort: Pulmonary effort is normal. No respiratory distress.     Breath sounds: Normal breath sounds and air entry.   Musculoskeletal:     Cervical back: Neck supple.  Lymphadenopathy:     Cervical: No cervical adenopathy.   Skin:    General: Skin is warm and dry.     Capillary Refill: Capillary refill takes less than 2 seconds.     Findings: No rash.   Neurological:     General: No focal deficit present.     Mental Status: He is alert and oriented to person, place, and time. Mental status is at baseline.     Cranial Nerves: No dysarthria or facial asymmetry.   Psychiatric:        Mood and Affect: Mood normal.        Speech: Speech normal.        Behavior: Behavior normal.        Thought Content: Thought  content normal.  Judgment: Judgment normal.      UC Treatments / Results  Labs (all labs ordered are listed, but only abnormal results are displayed) Labs Reviewed  POC SARS CORONAVIRUS 2 AG -  ED    EKG   Radiology No results found.  Procedures Procedures (including critical care time)  Medications Ordered in UC Medications - No data to display  Initial Impression / Assessment and Plan / UC Course  I have reviewed the triage vital signs and the nursing notes.  Pertinent labs & imaging results that were available during my care of the patient were reviewed by me and considered in my medical decision making (see chart for details).   1.  Viral pharyngitis Suspect viral URI, viral syndrome.  Strep/viral testing: POC COVID negative. Low suspicion for strep throat, therefore deferred testing today with patient agreement.  Physical exam findings reassuring, vital signs hemodynamically stable, and lungs clear, therefore deferred imaging of the chest.  Advised supportive care/prescriptions for symptomatic relief as outlined in AVS.    Counseled patient on potential for adverse effects with medications prescribed/recommended today, strict ER and return-to-clinic precautions discussed, patient verbalized understanding.    Final Clinical Impressions(s) / UC Diagnoses   Final diagnoses:  Viral pharyngitis     Discharge Instructions      Strep test in the clinic is negative, staff will call you if the throat culture is positive for bacteria in the next 2-3 days and call in treatment if necessary based on result.   For now, we will treat this as a viral infection with the following: - Over the counter medicines as needed for pain and swelling of the throat (Ibuprofen  600mg  and/or Tylenol  1,000mg  every 6 hours as needed) - 1 tablespoon of honey in warm water and/or salt water gargles every 3-4 hours  Seek medical care if you develop changes to your voice, inability to  swallow, drooling, or any new symptoms. If symptoms are severe, please go to the ER. I hope you feel better!     ED Prescriptions   None    PDMP not reviewed this encounter.   Starlene Eaton, [place] 02/18/24 1420

## 2024-02-18 NOTE — Discharge Instructions (Signed)
 Strep test in the clinic is negative, staff will call you if the throat culture is positive for bacteria in the next 2-3 days and call in treatment if necessary based on result.   For now, we will treat this as a viral infection with the following: - Over the counter medicines as needed for pain and swelling of the throat (Ibuprofen 600mg  and/or Tylenol 1,000mg  every 6 hours as needed) - 1 tablespoon of honey in warm water and/or salt water gargles every 3-4 hours  Seek medical care if you develop changes to your voice, inability to swallow, drooling, or any new symptoms. If symptoms are severe, please go to the ER. I hope you feel better!

## 2024-03-19 ENCOUNTER — Other Ambulatory Visit (HOSPITAL_COMMUNITY): Payer: Self-pay

## 2024-03-19 ENCOUNTER — Other Ambulatory Visit: Payer: Self-pay

## 2024-03-20 ENCOUNTER — Other Ambulatory Visit (HOSPITAL_COMMUNITY): Payer: Self-pay

## 2024-03-20 ENCOUNTER — Other Ambulatory Visit: Payer: Self-pay

## 2024-03-24 ENCOUNTER — Encounter (HOSPITAL_BASED_OUTPATIENT_CLINIC_OR_DEPARTMENT_OTHER): Payer: Self-pay | Admitting: Family Medicine

## 2024-04-23 ENCOUNTER — Other Ambulatory Visit (HOSPITAL_COMMUNITY): Payer: Self-pay

## 2024-04-25 ENCOUNTER — Ambulatory Visit
Admission: RE | Admit: 2024-04-25 | Discharge: 2024-04-25 | Disposition: A | Discharge status: Home / Self Care | Attending: Physician Assistant | Admitting: Physician Assistant

## 2024-04-25 ENCOUNTER — Other Ambulatory Visit: Payer: Self-pay

## 2024-04-25 VITALS — BP 137/95 | HR 93 | Temp 98.4°F | Resp 20 | Ht 69.0 in | Wt 199.0 lb

## 2024-04-25 DIAGNOSIS — R079 Chest pain, unspecified: Secondary | ICD-10-CM

## 2024-04-25 DIAGNOSIS — H9313 Tinnitus, bilateral: Secondary | ICD-10-CM | POA: Diagnosis not present

## 2024-04-25 NOTE — ED Triage Notes (Signed)
 Pt presents with a chief complaint of intermittent tinnitus x 1 month. Pt states this morning the ringing in ears was constant and he became very anxious. Checked his BP approximately one hour ago and it was 145/99. Currently denies pain. Pt did voice concerns as when he was driving here, there was a sharp pain on the left side of his chest. Lasted one second, pain has not occurred since.

## 2024-04-25 NOTE — Discharge Instructions (Addendum)
 Your EKG is overall reassuring at this time. Please continue to take your blood pressure medications as directed and exercise regularly. If you feel like the ringing in your ears is becoming more constant or interfering with your daily activity please contact your primary care provider as you may need referral to ENT services for further evaluation. If you start to develop more significant or lasting chest pain, difficulty breathing, headaches or vision changes and your blood pressure is elevated over 180 systolic and over 100 diastolic please go to the emergency room for evaluation.

## 2024-04-25 NOTE — ED Provider Notes (Signed)
 [facility]    CSN: [id number] Arrival date & time: 04/25/24  1008      History   Chief Complaint Chief Complaint  Patient presents with   Tinnitus    HPI Adrian Reed is a 43 y.o. male.   HPI  Pt is here for concerns of intermittent ringing in the ears - he states this happens for less than 30 seconds every few days but usually resolves He states this AM he woke up and had persistent ringing that lasted about an hour. He got up and checked his BP and it was 145/99 so this made him a bit more concerned He denies headaches, vision changes, chest pain, SOB He reports that on the way over to UC he started to get zaps in the chest that were brief in duration but occurred repeatedly for about 5 minutes on drive to UC  He reports a previous hx of migraines but denies abnormal symptoms.    Past Medical History:  Diagnosis Date   Anxiety    Diabetes mellitus, type II (HCC)    HTN (hypertension)    Hyperlipidemia     Patient Active Problem List   Diagnosis Date Noted   Neck pain 01/29/2024   Influenza 11/04/2023   Wellness examination 01/14/2023   Microcytosis 01/14/2023   Diabetes mellitus (HCC) 06/12/2022   Primary hypertension 06/12/2022   Hyperlipidemia 06/12/2022   Vitamin D deficiency 10/04/2015   Headache 07/01/2015   Intractable migraine without aura and without status migrainosus 07/01/2015   Fatigue 01/17/2015   Type 2 diabetes mellitus with hyperglycemia (HCC) 01/29/2014    History reviewed. No pertinent surgical history.     Home Medications    Prior to Admission medications   Medication Sig Start Date End Date Taking? Authorizing Provider  acarbose  (PRECOSE ) 100 MG tablet Take 1 tablet (100 mg total) by mouth 3 (three) times daily with meals. 12/25/23   [place], Quintin PARAS, MD  atorvastatin  (LIPITOR) 80 MG tablet Take 1 tablet (80 mg total) by mouth daily. 12/25/23   [place], Quintin PARAS, MD  Blood Glucose Monitoring Suppl (CONTOUR  NEXT MONITOR) w/Device KIT Use to check blood sugars once daily    [provider]  glipiZIDE  (GLIPIZIDE  XL) 5 MG 24 hr tablet Take 1 tablet (5 mg total) by mouth daily with breakfast. 01/28/24   [place], Quintin PARAS, MD  glucose blood (CONTOUR NEXT TEST) test strip Use to check FSBG In Vitro once daily    [provider]  ibuprofen  (ADVIL ) 800 MG tablet Take 1 tablet (800 mg total) by mouth 3 (three) times daily. 10/30/23   Armenta Canning, MD  lisinopril  (ZESTRIL ) 10 MG tablet Take 1 tablet (10 mg total) by mouth daily. 12/25/23   [place], Raymond J, MD  RYBELSUS  14 MG TABS Take 1 tablet by mouth every morning. 01/24/24   [provider]    Family History Family History  Problem Relation Age of Onset   Diabetes Sister     Social History Social History   Tobacco Use   Smoking status: Former    Current packs/day: 0.00    Types: Cigarettes    Quit date: 09/04/2003    Years since quitting: 20.6  Vaping Use   Vaping status: Never Used  Substance Use Topics   Alcohol use: Not Currently   Drug use: No     Allergies   Metformin hcl   Review of Systems Review of Systems  HENT:  Positive  for tinnitus.   Eyes:  Negative for visual disturbance.  Respiratory:  Negative for chest tightness, shortness of breath and wheezing.   Cardiovascular:  Negative for chest pain and palpitations.  Musculoskeletal:  Negative for myalgias.  Neurological:  Negative for dizziness, syncope, light-headedness and headaches.     Physical Exam Triage Vital Signs ED Triage Vitals  Encounter Vitals Group     BP 04/25/24 1032 (!) 137/95     Girls Systolic BP Percentile --      Girls Diastolic BP Percentile --      Boys Systolic BP Percentile --      Boys Diastolic BP Percentile --      Pulse Rate 04/25/24 1032 93     Resp 04/25/24 1032 20     Temp 04/25/24 1032 98.4 F (36.9 C)     Temp Source 04/25/24 1032 Oral     SpO2 04/25/24 1032 98 %     Weight 04/25/24 1032 199 lb  (90.3 kg)     Height 04/25/24 1032 5' 9 (1.753 m)     Head Circumference --      Peak Flow --      Pain Score 04/25/24 1041 0     Pain Loc --      Pain Education --      Exclude from Growth Chart --    No data found.  Updated Vital Signs BP (!) 137/95 (BP Location: Right Arm)   Pulse 93   Temp 98.4 F (36.9 C) (Oral)   Resp 20   Ht 5' 9 (1.753 m)   Wt 199 lb (90.3 kg)   SpO2 98%   BMI 29.39 kg/m   Visual Acuity Right Eye Distance:   Left Eye Distance:   Bilateral Distance:    Right Eye Near:   Left Eye Near:    Bilateral Near:     Physical Exam Vitals reviewed.  Constitutional:      General: He is awake. He is not in acute distress.    Appearance: Normal appearance. He is well-developed and well-groomed. He is not ill-appearing or toxic-appearing.  HENT:     Head: Normocephalic and atraumatic.     Right Ear: Hearing, tympanic membrane and ear canal normal.     Left Ear: Hearing, tympanic membrane and ear canal normal.     Mouth/Throat:     Lips: Pink.     Mouth: Mucous membranes are moist.     Pharynx: Oropharynx is clear. Uvula midline. No pharyngeal swelling, oropharyngeal exudate, posterior oropharyngeal erythema, uvula swelling or postnasal drip.     Tonsils: No tonsillar exudate or tonsillar abscesses.  Eyes:     General: Lids are normal. Gaze aligned appropriately.     Extraocular Movements: Extraocular movements intact.     Conjunctiva/sclera: Conjunctivae normal.  Cardiovascular:     Rate and Rhythm: Normal rate and regular rhythm.     Heart sounds: Normal heart sounds. No murmur heard.    No friction rub. No gallop.  Pulmonary:     Effort: Pulmonary effort is normal.     Breath sounds: Normal breath sounds. No decreased air movement. No decreased breath sounds, wheezing, rhonchi or rales.  Musculoskeletal:     Cervical back: Normal range of motion and neck supple.  Lymphadenopathy:     Head:     Right side of head: No submental, submandibular or  preauricular adenopathy.     Left side of head: No submental, submandibular or preauricular adenopathy.  Cervical:     Right cervical: No superficial cervical adenopathy.    Left cervical: No superficial cervical adenopathy.     Upper Body:     Right upper body: No supraclavicular adenopathy.     Left upper body: No supraclavicular adenopathy.  Skin:    General: Skin is warm and dry.  Neurological:     General: No focal deficit present.     Mental Status: He is alert and oriented to person, place, and time.  Psychiatric:        Mood and Affect: Mood normal.        Behavior: Behavior normal. Behavior is cooperative.        Thought Content: Thought content normal.        Judgment: Judgment normal.      UC Treatments / Results  Labs (all labs ordered are listed, but only abnormal results are displayed) Labs Reviewed - No data to display  EKG   Radiology No results found.  Procedures ED EKG  Date/Time: 04/25/2024 12:45 PM  Performed by: Marylene Rocky BRAVO, PA-C Authorized by: Marylene Rocky BRAVO, PA-C   Previous ECG:    Previous ECG:  Compared to current   Similarity:  No change   Comparison ECG info:  10/30/23 Interpretation:    Interpretation: normal   Rate:    ECG rate:  88   ECG rate assessment: normal   Rhythm:    Rhythm: sinus rhythm   QRS:    QRS axis:  Normal   QRS intervals:  Normal ST segments:    ST segments:  Normal T waves:    T waves: normal    (including critical care time)  Medications Ordered in UC Medications - No data to display  Initial Impression / Assessment and Plan / UC Course  I have reviewed the triage vital signs and the nursing notes.  Pertinent labs & imaging results that were available during my care of the patient were reviewed by me and considered in my medical decision making (see chart for details).      Final Clinical Impressions(s) / UC Diagnoses   Final diagnoses:  Tinnitus of both ears   Patient presents today with  concerns for tinnitus of both ears that has been ongoing for about 2 months but recently became worse.  He also reports concerns for frequent, limited sharp chest pains that started today.  EKG findings were reassuring without signs of ST elevation or depression or T wave changes.  Physical exam is largely reassuring without signs of obvious infection or irritation.  Patient reports concerns for elevated blood pressure but BP is only mildly elevated in clinic.  Reviewed with patient that he should continue taking prescribed blood pressure medications for optimal control.  Without obvious known ear injury or loud noises that would cause ringing I suspect potential inner ear dysfunction but cannot rule out hearing changes.  Reviewed with patient that if symptoms continue he should follow-up with PCP and potential referral to ENT for further evaluation and potential hearing aid consult.  Patient voices agreement and understanding with recommendations.  Reviewed signs and symptoms consistent of hypertensive emergency requiring evaluation in the ED.  These recommendations were also provided in AVS.  Follow-up as needed.    Discharge Instructions      Your EKG is overall reassuring at this time. Please continue to take your blood pressure medications as directed and exercise regularly. If you feel like the ringing in your ears is becoming more  constant or interfering with your daily activity please contact your primary care provider as you may need referral to ENT services for further evaluation. If you start to develop more significant or lasting chest pain, difficulty breathing, headaches or vision changes and your blood pressure is elevated over 180 systolic and over 100 diastolic please go to the emergency room for evaluation.     ED Prescriptions   None    PDMP not reviewed this encounter.   Marylene Rocky BRAVO, PA-C 04/26/24 1850

## 2024-04-28 ENCOUNTER — Ambulatory Visit (HOSPITAL_BASED_OUTPATIENT_CLINIC_OR_DEPARTMENT_OTHER): Admitting: Family Medicine

## 2024-04-28 VITALS — BP 133/89 | HR 81 | Ht 69.0 in | Wt 198.0 lb

## 2024-04-28 DIAGNOSIS — H9319 Tinnitus, unspecified ear: Secondary | ICD-10-CM | POA: Insufficient documentation

## 2024-04-28 DIAGNOSIS — H9313 Tinnitus, bilateral: Secondary | ICD-10-CM | POA: Diagnosis not present

## 2024-04-28 NOTE — Assessment & Plan Note (Addendum)
 Ringing in the ears for some time but with worsening noted recently. Previously was of short duration. Over the weekend, was at low level but constant/persistent. Maybe notices more in right ear than left. No change in hearing. On exam, patient is in no acute distress. Bilateral external auditory canals are clear with normal-appearing tympanic membranes. Discussed considerations, we can proceed with referral to audiology for further assessment.  We can also proceed with referral to ENT for additional evaluation.  Suspect that he will be able to have audiometry completed prior to meeting with ENT which would likely be beneficial so they will have that information to review as well

## 2024-04-28 NOTE — Patient Instructions (Signed)

## 2024-04-28 NOTE — Progress Notes (Signed)
    Procedures performed today:    None.  Independent interpretation of notes and tests performed by another provider:   None.  Brief History, Exam, Impression, and Recommendations:    BP 133/89 (BP Location: Left Arm, Patient Position: Sitting, Cuff Size: Normal)   Pulse 81   Ht 5' 9 (1.753 m)   Wt 198 lb (89.8 kg)   SpO2 100%   BMI 29.24 kg/m   Tinnitus of both ears Assessment & Plan: Ringing in the ears for some time but with worsening noted recently. Previously was of short duration. Over the weekend, was at low level but constant/persistent. Maybe notices more in right ear than left. No change in hearing. On exam, patient is in no acute distress. Bilateral external auditory canals are clear with normal-appearing tympanic membranes. Discussed considerations, we can proceed with referral to audiology for further assessment.  We can also proceed with referral to ENT for additional evaluation.  Suspect that he will be able to have audiometry completed prior to meeting with ENT which would likely be beneficial so they will have that information to review as well  Orders: -     Ambulatory referral to ENT -     Ambulatory referral to Audiology  Return if symptoms worsen or fail to improve.   ___________________________________________ Marjan Rosman [place], MD, ABFM, CAQSM Primary [facility]

## 2024-05-06 ENCOUNTER — Other Ambulatory Visit (HOSPITAL_BASED_OUTPATIENT_CLINIC_OR_DEPARTMENT_OTHER): Payer: Self-pay | Admitting: *Deleted

## 2024-05-06 MED ORDER — GLIPIZIDE ER 5 MG PO TB24: 5.0000 mg | ORAL_TABLET | Freq: Every day | ORAL | 3 refills | Status: AC

## 2024-06-02 ENCOUNTER — Ambulatory Visit: Attending: Family Medicine | Admitting: Audiologist

## 2024-06-02 DIAGNOSIS — H9313 Tinnitus, bilateral: Secondary | ICD-10-CM | POA: Diagnosis present

## 2024-06-03 NOTE — Procedures (Signed)
 Outpatient Audiology and [facility] [address] [place], [place]  [address] [phone]  AUDIOLOGICAL  EVALUATION  NAME: Adrian Reed     DOB:   31-Mar-1981      MRN: [id number]                                                                                     DATE: 06/03/2024     REFERENT: de [place], Raymond J, MD STATUS: Outpatient DIAGNOSIS: Tinnitus    History: Adrian Reed was seen for an audiological evaluation due to ringing in the ears. He described the sound as a high pitched buzz. He had it intermittently for a long time. It suddenly became constant and bothersome in late August. He went to Urgent Care. His heart rate was very and high and he was stressed. He now feels the tinnitus is improving in severity. He has adapted to it more. He checks to see if its still there and can find it, but it is not at the forefront anymore. It is bothersome at night. He is not using any sounds at night, just the air conditioning. The tinnitus in is both ears. It stays the same pitch. There was no precipitating incident like loud music, stressful event, or new medication. Adrian Reed denies pain, pressure or hearing loss.    Evaluation:  Otoscopy showed a clear view of the tympanic membranes, bilaterally Tympanometry results were consistent with normal middle ear function, bilaterally   Audiometric testing was completed using Conventional Audiometry techniques with insert earphones and supraural headphones. Test results are consistent with normal hearing 250-8kHz bilaterally. Speech Recognition Thresholds were obtained at 10 dB HL in the right ear and at 10  dB HL in the left ear. Word Recognition Testing was completed at  40dB SL and Adrian Reed scored 100% in each ear.  Tinnitus pitch and loudness matched to 8kHz at 14dB AU. This is a very soft sound, <5dB SL.    Results:  The test results were reviewed with Adrian Reed [name] has normal hearing in each ear. The tinnitus was matched to  a 8kHz 14dB pure tone. This high pitch can be masked by steady state lower pitched sound, such as brown noise. Tinnitus management packet reviewed with Adrian Reed.  Audiogram printed and provided to Adrian Reed.   Recommendations: Use of Ozlo Sleep Buds or the MyNoise App is recommended to mask tinnitus when needed. Do not fixate or 'go looking' for tinnitus. Notice sound, turn on masker, and move on.  Use of masker should be at night and when in quiet environments where the tinnitus is loud or when around triggering sound. Masker should be played at lowest level possible that provides relief from tinnitus. Refer to tinnitus packet provided during appointment for more information. Earplugs only recommended when around continuous exposure to truly loud sound, such as a concert. Not recommended for every day use, they may exacerbate awareness of tinnitus. .     32 minutes spent testing and counseling on results.   If you have any questions please feel free to contact me at [phone].  Lauraine Ka Stalnaker Au.D.  Audiologist   06/03/2024  9:08 AM  Cc: de [place],  Quintin PARAS, MD

## 2024-06-11 ENCOUNTER — Encounter (HOSPITAL_BASED_OUTPATIENT_CLINIC_OR_DEPARTMENT_OTHER): Payer: Self-pay | Admitting: Family Medicine

## 2024-06-11 DIAGNOSIS — R718 Other abnormality of red blood cells: Secondary | ICD-10-CM

## 2024-06-16 NOTE — Telephone Encounter (Signed)
 LVM for patient to call the office / mychart msg sent as well

## 2024-06-23 ENCOUNTER — Other Ambulatory Visit: Payer: Self-pay

## 2024-06-30 ENCOUNTER — Encounter (INDEPENDENT_AMBULATORY_CARE_PROVIDER_SITE_OTHER): Payer: Self-pay | Admitting: Otolaryngology

## 2024-06-30 ENCOUNTER — Ambulatory Visit (INDEPENDENT_AMBULATORY_CARE_PROVIDER_SITE_OTHER): Admitting: Otolaryngology

## 2024-06-30 VITALS — BP 134/85 | HR 84 | Temp 98.5°F | Ht 69.0 in | Wt 195.0 lb

## 2024-06-30 DIAGNOSIS — H9313 Tinnitus, bilateral: Secondary | ICD-10-CM | POA: Diagnosis not present

## 2024-07-01 NOTE — Progress Notes (Signed)
 CC: Bilateral tinnitus  HPI: Discussed the use of AI scribe software for clinical note transcription with the patient, who gave verbal consent to proceed.  History of Present Illness Adrian Reed is a 43 year old male who presents with bilateral tinnitus.  He has been experiencing a constant high-pitched ringing in both ears since late August. The tinnitus becomes more noticeable when he focuses on it, but he does not have difficulty hearing. He reports that he already had a hearing test prior to this visit.  His hearing test showed normal hearing bilaterally across all frequencies.  He uses Flonase regularly, especially at night, and takes Allegra as needed for allergy management.  No history of loud noise exposure, such as explosions or recent loud concerts, although he listened to loud music growing up. No history of ear infections or surgeries in the ear, nose, or throat area.     Past Medical History:  Diagnosis Date   Anxiety    Diabetes mellitus, type II (HCC)    HTN (hypertension)    Hyperlipidemia     History reviewed. No pertinent surgical history.  Family History  Problem Relation Age of Onset   Diabetes Sister     Social History:  reports that he quit smoking about 20 years ago. His smoking use included cigarettes. He does not have any smokeless tobacco history on file. He reports that he does not currently use alcohol. He reports that he does not use drugs.  Allergies:  Allergies  Allergen Reactions   Metformin Hcl Diarrhea    Stomach pain    Prior to Admission medications   Medication Sig Start Date End Date Taking? Authorizing Provider  acarbose  (PRECOSE ) 100 MG tablet Take 1 tablet (100 mg total) by mouth 3 (three) times daily with meals. 12/25/23  Yes de [place], Raymond J, MD  atorvastatin  (LIPITOR) 80 MG tablet Take 1 tablet (80 mg total) by mouth daily. 12/25/23  Yes de [place], Raymond J, MD  Blood Glucose Monitoring Suppl (CONTOUR NEXT MONITOR)  w/Device KIT Use to check blood sugars once daily   Yes [provider]  glipiZIDE  (GLIPIZIDE  XL) 5 MG 24 hr tablet Take 1 tablet (5 mg total) by mouth daily with breakfast. 05/06/24  Yes de [place], Quintin PARAS, MD  glucose blood (CONTOUR NEXT TEST) test strip Use to check FSBG In Vitro once daily   Yes [provider]  lisinopril  (ZESTRIL ) 10 MG tablet Take 1 tablet (10 mg total) by mouth daily. 12/25/23  Yes de [place], Raymond J, MD  RYBELSUS  14 MG TABS Take 1 tablet by mouth every morning. 01/24/24  Yes [provider]    Blood pressure 134/85, pulse 84, temperature 98.5 F (36.9 C), temperature source Oral, height 5' 9 (1.753 m), weight 195 lb (88.5 kg), SpO2 98%. Exam: General: Communicates without difficulty, well nourished, no acute distress. Head: Normocephalic, no evidence injury, no tenderness, facial buttresses intact without stepoff. Face/sinus: No tenderness to palpation and percussion. Facial movement is normal and symmetric. Eyes: PERRL, EOMI. No scleral icterus, conjunctivae clear. Neuro: CN II exam reveals vision grossly intact.  No nystagmus at any point of gaze. Ears: Auricles well formed without lesions.  Ear canals are intact without mass or lesion.  No erythema or edema is appreciated.  The TMs are intact without fluid. Nose: External evaluation reveals normal support and skin without lesions.  Dorsum is intact.  Anterior rhinoscopy reveals normal mucosa over anterior aspect of inferior turbinates and intact septum.  No purulence noted. Oral:  Oral cavity and oropharynx are intact, symmetric, without erythema or edema.  Mucosa is moist without lesions. Neck: Full range of motion without pain.  There is no significant lymphadenopathy.  No masses palpable.  Thyroid bed within normal limits to palpation.  Parotid glands and submandibular glands equal bilaterally without mass.  Trachea is midline. Neuro:  CN 2-12 grossly intact.   Assessment and Plan Assessment &  Plan Bilateral tinnitus Since late August, described as a constant ringing sound. Normal hearing test and no recent loud noise exposure. No ear infections or surgeries.  - The physical exam findings and the hearing test results are reviewed with the patient. - The strategies to cope with tinnitus, including the use of masker, hearing aids, tinnitus retraining therapy, and avoidance of caffeine and alcohol are discussed.    Ryun Velez W Elmer Merwin 07/01/2024, 8:41 AM

## 2024-07-17 ENCOUNTER — Other Ambulatory Visit: Payer: Self-pay

## 2024-07-17 ENCOUNTER — Ambulatory Visit: Admission: RE | Admit: 2024-07-17 | Discharge: 2024-07-17 | Disposition: A | Discharge status: Home / Self Care

## 2024-07-17 VITALS — BP 123/87 | HR 111 | Temp 98.4°F | Resp 17 | Ht 69.0 in | Wt 185.0 lb

## 2024-07-17 DIAGNOSIS — B349 Viral infection, unspecified: Secondary | ICD-10-CM | POA: Diagnosis not present

## 2024-07-17 LAB — POC COVID19/FLU A&B COMBO
Covid Antigen, POC: NEGATIVE
Influenza A Antigen, POC: NEGATIVE
Influenza B Antigen, POC: NEGATIVE

## 2024-07-17 MED ORDER — AZELASTINE HCL 0.1 % NA SOLN: 1.0000 | Freq: Two times a day (BID) | NASAL | 1 refills | Status: AC

## 2024-07-17 MED ORDER — PROMETHAZINE-DM 6.25-15 MG/5ML PO SYRP: 10.0000 mL | ORAL_SOLUTION | Freq: Three times a day (TID) | ORAL | 0 refills | Status: DC | PRN

## 2024-07-17 MED ORDER — IBUPROFEN 800 MG PO TABS: 800.0000 mg | ORAL_TABLET | Freq: Three times a day (TID) | ORAL | 0 refills | Status: AC

## 2024-07-17 NOTE — ED Triage Notes (Signed)
 Pt presents with a chief complaint of generalized body aches. Noticed aches yesterday. Currently rates overall pain a 7/10. OTC Advil  taken with improvement in symptoms. Does endorse an increase in HR while at rest and on exertion. Usually works from home however has been in person all week, denies sick contacts.

## 2024-07-17 NOTE — Discharge Instructions (Addendum)
  1. Acute viral syndrome (Primary) - POC Covid19/Flu A&B Antigen complete in UC is negative - azelastine (ASTELIN) 0.1 % nasal spray; Place 1 spray into both nostrils 2 (two) times daily. Use in each nostril as directed  Dispense: 30 mL; Refill: 1 - ibuprofen  (ADVIL ) 800 MG tablet; Take 1 tablet (800 mg total) by mouth 3 (three) times daily.  Dispense: 30 tablet; Refill: 0 - promethazine-dextromethorphan (PROMETHAZINE-DM) 6.25-15 MG/5ML syrup; Take 10 mLs by mouth 3 (three) times daily as needed for cough.  Dispense: 240 mL; Refill: 0 -Continue to monitor symptoms for any change in severity if there is any escalation of current symptoms or development of new symptoms follow-up in ER for further evaluation and management.

## 2024-07-17 NOTE — ED Provider Notes (Signed)
 [facility]  Note:  This document was prepared using Dragon voice recognition software and may include unintentional dictation errors.  MRN: [id number] DOB: 18-Jan-1981  Subjective:   Adrian Reed is a 43 y.o. male presenting for generalized body aches, chills, persistent cough, palpitations with exertion and persistent at rest x 2 to 3 days.  Patient requesting COVID and flu testing today in urgent care to make sure this is not the cause of his symptoms.  Denies any known sick contacts.  Patient reports that he has been taking Advil  400 mg with minimal improvement to symptoms.  Denies taking any medication to suppress cough.  No shortness of breath, chest pain, weakness, dizziness.  No current facility-administered medications for this encounter.  Current Outpatient Medications:    azelastine (ASTELIN) 0.1 % nasal spray, Place 1 spray into both nostrils 2 (two) times daily. Use in each nostril as directed, Disp: 30 mL, Rfl: 1   ibuprofen  (ADVIL ) 800 MG tablet, Take 1 tablet (800 mg total) by mouth 3 (three) times daily., Disp: 30 tablet, Rfl: 0   promethazine-dextromethorphan (PROMETHAZINE-DM) 6.25-15 MG/5ML syrup, Take 10 mLs by mouth 3 (three) times daily as needed for cough., Disp: 240 mL, Rfl: 0   acarbose  (PRECOSE ) 100 MG tablet, Take 1 tablet (100 mg total) by mouth 3 (three) times daily with meals., Disp: 270 tablet, Rfl: 1   atorvastatin  (LIPITOR) 80 MG tablet, Take 1 tablet (80 mg total) by mouth daily., Disp: 90 tablet, Rfl: 2   Blood Glucose Monitoring Suppl (CONTOUR NEXT MONITOR) w/Device KIT, Use to check blood sugars once daily, Disp: , Rfl:    glipiZIDE  (GLIPIZIDE  XL) 5 MG 24 hr tablet, Take 1 tablet (5 mg total) by mouth daily with breakfast., Disp: 90 tablet, Rfl: 3   glucose blood (CONTOUR NEXT TEST) test strip, Use to check FSBG In Vitro once daily, Disp: , Rfl:    lisinopril  (ZESTRIL ) 10 MG tablet, Take 1 tablet (10 mg total) by mouth daily.,  Disp: 90 tablet, Rfl: 2   RYBELSUS  14 MG TABS, Take 1 tablet by mouth every morning., Disp: , Rfl:    Allergies  Allergen Reactions   Metformin Hcl Diarrhea    Stomach pain    Past Medical History:  Diagnosis Date   Anxiety    Diabetes mellitus, type II (HCC)    HTN (hypertension)    Hyperlipidemia      History reviewed. No pertinent surgical history.  Family History  Problem Relation Age of Onset   Diabetes Sister     Social History   Tobacco Use   Smoking status: Former    Current packs/day: 0.00    Types: Cigarettes    Quit date: 09/04/2003    Years since quitting: 20.8  Vaping Use   Vaping status: Never Used  Substance Use Topics   Alcohol use: Not Currently   Drug use: No    ROS Refer to HPI for ROS details.  Objective:   Vitals: BP 123/87 (BP Location: Right Arm)   Pulse (!) 111   Temp 98.4 F (36.9 C) (Oral)   Resp 17 Comment: feels a little SOB however is very nervous.  Ht 5' 9 (1.753 m)   Wt 185 lb (83.9 kg)   SpO2 98%   BMI 27.32 kg/m   Physical Exam Vitals and nursing note reviewed.  Constitutional:      General: He is not in acute distress.    Appearance: Normal appearance. He is well-developed. He is  not ill-appearing or toxic-appearing.  HENT:     Head: Normocephalic.     Nose: Congestion present. No rhinorrhea.     Mouth/Throat:     Mouth: Mucous membranes are moist.     Pharynx: Oropharynx is clear. No posterior oropharyngeal erythema.  Cardiovascular:     Rate and Rhythm: Normal rate.  Pulmonary:     Effort: Pulmonary effort is normal. No respiratory distress.     Breath sounds: No stridor. No wheezing.  Skin:    General: Skin is warm and dry.  Neurological:     General: No focal deficit present.     Mental Status: He is alert and oriented to person, place, and time.  Psychiatric:        Mood and Affect: Mood normal.        Behavior: Behavior normal.     Procedures  Results for orders placed or performed during the  hospital encounter of 07/17/24 (from the past 24 hours)  POC Covid19/Flu A&B Antigen     Status: None   Collection Time: 07/17/24 10:54 AM  Result Value Ref Range   Influenza A Antigen, POC Negative Negative   Influenza B Antigen, POC Negative Negative   Covid Antigen, POC Negative Negative    No results found.   Assessment and Plan :     Discharge Instructions       1. Acute viral syndrome (Primary) - POC Covid19/Flu A&B Antigen complete in UC is negative - azelastine (ASTELIN) 0.1 % nasal spray; Place 1 spray into both nostrils 2 (two) times daily. Use in each nostril as directed  Dispense: 30 mL; Refill: 1 - ibuprofen  (ADVIL ) 800 MG tablet; Take 1 tablet (800 mg total) by mouth 3 (three) times daily.  Dispense: 30 tablet; Refill: 0 - promethazine-dextromethorphan (PROMETHAZINE-DM) 6.25-15 MG/5ML syrup; Take 10 mLs by mouth 3 (three) times daily as needed for cough.  Dispense: 240 mL; Refill: 0 -Continue to monitor symptoms for any change in severity if there is any escalation of current symptoms or development of new symptoms follow-up in ER for further evaluation and management.      Mckenna Boruff B Javarie Crisp   Yoshino Broccoli, [place], [place] 07/17/24 1130

## 2024-08-04 ENCOUNTER — Other Ambulatory Visit (HOSPITAL_BASED_OUTPATIENT_CLINIC_OR_DEPARTMENT_OTHER)

## 2024-08-04 DIAGNOSIS — R718 Other abnormality of red blood cells: Secondary | ICD-10-CM

## 2024-08-06 ENCOUNTER — Encounter (HOSPITAL_BASED_OUTPATIENT_CLINIC_OR_DEPARTMENT_OTHER): Payer: Self-pay

## 2024-08-06 ENCOUNTER — Encounter (HOSPITAL_BASED_OUTPATIENT_CLINIC_OR_DEPARTMENT_OTHER): Payer: Self-pay | Admitting: Family Medicine

## 2024-08-06 ENCOUNTER — Ambulatory Visit (HOSPITAL_BASED_OUTPATIENT_CLINIC_OR_DEPARTMENT_OTHER): Admitting: Family Medicine

## 2024-08-06 VITALS — BP 135/89 | HR 100 | Temp 98.1°F | Resp 18 | Ht 69.0 in | Wt 203.0 lb

## 2024-08-06 DIAGNOSIS — E1165 Type 2 diabetes mellitus with hyperglycemia: Secondary | ICD-10-CM

## 2024-08-06 DIAGNOSIS — Z0000 Encounter for general adult medical examination without abnormal findings: Secondary | ICD-10-CM

## 2024-08-06 DIAGNOSIS — Z7984 Long term (current) use of oral hypoglycemic drugs: Secondary | ICD-10-CM | POA: Diagnosis not present

## 2024-08-06 DIAGNOSIS — I459 Conduction disorder, unspecified: Secondary | ICD-10-CM | POA: Diagnosis not present

## 2024-08-06 DIAGNOSIS — E66811 Obesity, class 1: Secondary | ICD-10-CM | POA: Insufficient documentation

## 2024-08-06 LAB — IRON,TIBC AND FERRITIN PANEL
Ferritin: 70 ng/mL (ref 30–400)
Iron Saturation: 22 % (ref 15–55)
Iron: 73 ug/dL (ref 38–169)
Total Iron Binding Capacity: 335 ug/dL (ref 250–450)
UIBC: 262 ug/dL (ref 111–343)

## 2024-08-06 LAB — HGB FRACTIONATION CASCADE
Hgb A2: 2.5 % (ref 1.8–3.2)
Hgb A: 97.5 % (ref 96.4–98.8)
Hgb F: 0 % (ref 0.0–2.0)
Hgb S: 0 %

## 2024-08-06 LAB — POCT GLYCOSYLATED HEMOGLOBIN (HGB A1C): Hemoglobin A1C: 7.6 % — AB (ref 4.0–5.6)

## 2024-08-06 NOTE — Assessment & Plan Note (Signed)
 Patient continues with Rybelsus , acarbose , glipizide .  He also continues to follow-up with endocrinology.  No concerns today related to medication.  No issues with polyuria or polydipsia. A1c in office today is 7.6% which is improved from last check which was. We discussed considerations and can continue with current medication regimen, no changes today.  Can focus on lifestyle modifications to help with controlling blood sugar numbers. Foot exam up-to-date Urine ACR to be checked today

## 2024-08-06 NOTE — Progress Notes (Signed)
    Procedures performed today:    None.  Independent interpretation of notes and tests performed by another provider:   None.  Brief History, Exam, Impression, and Recommendations:    BP 135/89 (BP Location: Left Arm, Patient Position: Sitting)   Pulse 100   Temp 98.1 F (36.7 C) (Oral)   Resp 18   Ht 5' 9 (1.753 m)   Wt 203 lb (92.1 kg)   SpO2 97%   BMI 29.98 kg/m   Type 2 diabetes mellitus with hyperglycemia, without long-term current use of insulin (HCC) Assessment & Plan: Patient continues with Rybelsus , acarbose , glipizide .  He also continues to follow-up with endocrinology.  No concerns today related to medication.  No issues with polyuria or polydipsia. A1c in office today is 7.6% which is improved from last check which was. We discussed considerations and can continue with current medication regimen, no changes today.  Can focus on lifestyle modifications to help with controlling blood sugar numbers. Foot exam up-to-date Urine ACR to be checked today  Orders: -     POCT glycosylated hemoglobin (Hb A1C) -     Microalbumin / creatinine urine ratio  Wellness examination -     CBC with Differential/Platelet -     Comprehensive metabolic panel with GFR -     Hemoglobin A1c -     Lipid panel -     TSH Rfx on Abnormal to Free T4  Skipped heart beats Assessment & Plan: Patient reports intermittent issues with skipped heartbeats.  He notes that this has been going on for some time.  Generally occurs infrequently.  When it does occur, it is very brief, lasting on the order of seconds.  Denies any associated chest pain, shortness of breath, trouble breathing, dizziness.  Not generally associated with periods of rest or inactivity. On exam, patient is in no acute distress, vital signs stable.  Cardiovascular exam with regular rate and rhythm, no murmur appreciated.  Lungs clear to auscultation bilaterally. We discussed general considerations, based on history, would  suspect possible irregular beat such as PVCs contributing to symptoms.  We discussed consideration, consider performing EKG today, patient declines.  Also consider Holter monitor which would allow for more complete assessment of possible events with observation of electrical activity when event occurs.  For now, he would prefer to hold off on additional testing.  We did discuss signs and symptoms to be mindful of for which further evaluation would be necessary.   Return in about 6 months (around 02/04/2025).   ___________________________________________ Ruthia Person de [place], MD, ABFM, CAQSM Primary Care and Sports Medicine [facility]

## 2024-08-07 LAB — MICROALBUMIN / CREATININE URINE RATIO
Creatinine, Urine: 117.1 mg/dL
Microalb/Creat Ratio: <3 mg/g{creat} (ref 0–29)
Microalbumin, Urine: <3 ug/mL

## 2024-08-09 NOTE — Assessment & Plan Note (Signed)
 Patient reports intermittent issues with skipped heartbeats.  He notes that this has been going on for some time.  Generally occurs infrequently.  When it does occur, it is very brief, lasting on the order of seconds.  Denies any associated chest pain, shortness of breath, trouble breathing, dizziness.  Not generally associated with periods of rest or inactivity. On exam, patient is in no acute distress, vital signs stable.  Cardiovascular exam with regular rate and rhythm, no murmur appreciated.  Lungs clear to auscultation bilaterally. We discussed general considerations, based on history, would suspect possible irregular beat such as PVCs contributing to symptoms.  We discussed consideration, consider performing EKG today, patient declines.  Also consider Holter monitor which would allow for more complete assessment of possible events with observation of electrical activity when event occurs.  For now, he would prefer to hold off on additional testing.  We did discuss signs and symptoms to be mindful of for which further evaluation would be necessary.

## 2024-08-12 ENCOUNTER — Ambulatory Visit (HOSPITAL_BASED_OUTPATIENT_CLINIC_OR_DEPARTMENT_OTHER): Payer: Self-pay | Admitting: Family Medicine

## 2024-08-14 ENCOUNTER — Encounter (HOSPITAL_BASED_OUTPATIENT_CLINIC_OR_DEPARTMENT_OTHER): Payer: Self-pay | Admitting: Family Medicine

## 2024-08-31 ENCOUNTER — Other Ambulatory Visit (HOSPITAL_BASED_OUTPATIENT_CLINIC_OR_DEPARTMENT_OTHER): Payer: Self-pay | Admitting: Family Medicine

## 2024-08-31 ENCOUNTER — Other Ambulatory Visit: Payer: Self-pay

## 2024-08-31 MED ORDER — ACARBOSE 100 MG PO TABS
100.0000 mg | ORAL_TABLET | Freq: Three times a day (TID) | ORAL | 1 refills | Status: AC
  Filled 2024-08-31: qty 270, 90d supply, fill #0

## 2024-09-01 ENCOUNTER — Other Ambulatory Visit: Payer: Self-pay

## 2024-09-16 ENCOUNTER — Other Ambulatory Visit (HOSPITAL_BASED_OUTPATIENT_CLINIC_OR_DEPARTMENT_OTHER): Payer: Self-pay | Admitting: Family Medicine

## 2024-09-16 MED ORDER — LISINOPRIL 10 MG PO TABS
10.0000 mg | ORAL_TABLET | Freq: Every day | ORAL | 2 refills | Status: AC
  Filled 2024-09-16: qty 90, 90d supply, fill #0

## 2024-09-17 ENCOUNTER — Other Ambulatory Visit: Payer: Self-pay

## 2024-09-24 ENCOUNTER — Ambulatory Visit: Admission: EM | Admit: 2024-09-24 | Discharge: 2024-09-24 | Disposition: A | Discharge status: Home / Self Care

## 2024-09-24 ENCOUNTER — Other Ambulatory Visit: Payer: Self-pay

## 2024-09-24 DIAGNOSIS — J00 Acute nasopharyngitis [common cold]: Secondary | ICD-10-CM

## 2024-09-24 NOTE — ED Triage Notes (Signed)
 Pt c/o bodyaches, productive cough w/brown mucous, nasal drainage, throat congestion started last night. Pt states took a home flu/COVID test this morning and it was neg.

## 2024-09-24 NOTE — Discharge Instructions (Signed)
" °  Nasopharyngitis is a viral infection of the nose and throat. It commonly causes congestion, runny nose, sore throat, cough, and sinus pressure. Antibiotics are not helpful because this is a viral illness.  Expected Duration & Recovery Symptoms usually peak over the first 3-5 days Gradual improvement occurs over 7-10 days Cough or nasal drainage may last up to 2 weeks Recovery is supported by rest, fluids, and symptom control  Goal of Care The main goal is to reduce inflammation and promote mucus drainage, which helps relieve pressure, cough, and throat irritation.  Treatment & Symptom Management Ibuprofen  (Advil ) 600 mg every 6 hours as needed Why it helps: Reduces inflammation, throat pain, headache, sinus pressure, and fever How to take: Take with food Common side effects: Stomach irritation, heartburn, dizziness Over-the-Counter Cold Medications (per package instructions) Avoid those with pseudoephedrine and phenylephrine due to your hypertension Why they help: Decrease nasal swelling and mucus production to improve drainage Common side effects: Dry mouth, jitteriness, trouble sleeping Steam Showers Loosen mucus Improve sinus and nasal drainage Can be done 1-2 times daily as needed  Why Viral Illnesses Can Cause a Fast Heart Rate During a viral infection: The body releases inflammatory chemicals that increase heart rate Fever raises the bodys metabolic demand, causing the heart to pump faster Dehydration from poor intake or sweating can lower blood volume, increasing heart rate As symptoms improve and hydration and fever resolve, heart rate typically returns to normal.  When seek medical care Symptoms last longer than 10-14 days Worsening sinus pain or pressure Persistent fever Ongoing rapid heart rate despite hydration and fever control  Go to the Emergency Department if You Have Shortness of breath or chest pain Fainting or near-fainting Heart rate persistently very  high at rest Confusion or signs of severe dehydration  "

## 2024-09-24 NOTE — ED Provider Notes (Signed)
 [facility]    CSN: [id number] Arrival date & time: 09/24/24  0847      History   Chief Complaint No chief complaint on file.   HPI Adrian Reed is a 44 y.o. male.   Adrian Reed presented with a chief complaint of body aches, mucus production, sore throat, and a productive cough that began last night. He also reported congestion and fatigue leading up to last night, but denied experiencing fever, headaches, ear pain, difficulty breathing, wheezing, nausea, vomiting, or diarrhea. Adrian Reed did not report exposure to anyone known to be sick and performed at-home flu and COVID testing this morning, both of which were negative. He mentioned a history of similar congestion about a month ago, which responded well to treatment. Adrian Reed took Mucinex and Advil , noting some improvement in symptoms, although the Mucinex made his nose runnier. He expressed concern about possibly being contagious to his fifteen-month-old child who attends daycare and has been frequently sick with similar symptoms  The history is provided by the patient.    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 Authorizing Provider  acarbose  (PRECOSE ) 100 MG tablet Take 1 tablet (100 mg total) by mouth 3 (three)  times daily with meals. 08/31/24   [place], Raymond J, MD  atorvastatin  (LIPITOR) 80 MG tablet Take 1 tablet (80 mg total) by mouth daily. 12/25/23   [place], Quintin PARAS, MD  azelastine  (ASTELIN ) 0.1 % nasal spray Place 1 spray into both nostrils 2 (two) times daily. Use in each nostril as directed 07/17/24   Reddick, Ethel B, NP  Blood Glucose Monitoring Suppl (CONTOUR NEXT MONITOR) w/Device KIT Use to check blood sugars once daily    [provider]  glipiZIDE  (GLIPIZIDE  XL) 5 MG 24 hr tablet Take 1 tablet (5 mg total) by mouth daily with breakfast. 05/06/24   [place], Quintin PARAS, MD  glucose blood (CONTOUR NEXT TEST) test strip Use to check FSBG In Vitro once daily    [provider]  ibuprofen  (ADVIL ) 800 MG tablet Take 1 tablet (800 mg total) by mouth 3 (three) times daily. 07/17/24   Reddick, Johnathan B, NP  lisinopril  (ZESTRIL ) 10 MG tablet Take 1 tablet (10 mg total) by mouth daily. 09/16/24   [place], Raymond J, MD  promethazine -dextromethorphan (PROMETHAZINE -DM) 6.25-15 MG/5ML syrup Take 10 mLs by mouth 3 (three) times daily as needed for cough. Patient not taking: Reported on 08/06/2024 07/17/24   Aurea Ethel B, NP  RYBELSUS  14 MG TABS Take 1 tablet by mouth every morning. 01/24/24   [provider]    Family History Family History  Problem Relation Age of Onset   Diabetes Sister     Social History Social  History[1]   Allergies   Metformin hcl   Review of Systems Review of Systems  Constitutional:  Positive for fatigue. Negative for activity change, appetite change, chills, diaphoresis and fever.  HENT:  Positive for congestion, postnasal drip, rhinorrhea and sore throat. Negative for ear discharge, ear pain, sinus pressure and sinus pain.   Respiratory:  Positive for cough. Negative for chest tightness, shortness of breath and wheezing.   Gastrointestinal:  Negative for abdominal pain, diarrhea, nausea and vomiting.  Musculoskeletal:   Positive for myalgias.  Neurological:  Negative for headaches.     Physical Exam Triage Vital Signs ED Triage Vitals  Encounter Vitals Group     BP 09/24/24 0858 136/87     Girls Systolic BP Percentile --      Girls Diastolic BP Percentile --      Boys Systolic BP Percentile --      Boys Diastolic BP Percentile --      Pulse Rate 09/24/24 0858 (!) 120     Resp 09/24/24 0858 18     Temp 09/24/24 0858 98.8 F (37.1 C)     Temp Source 09/24/24 0858 Oral     SpO2 09/24/24 0858 98 %     Weight --      Height --      Head Circumference --      Peak Flow --      Pain Score 09/24/24 0856 7     Pain Loc --      Pain Education --      Exclude from Growth Chart --    No data found.  Updated Vital Signs BP 136/87   Pulse (!) 120   Temp 98.8 F (37.1 C) (Oral)   Resp 18   SpO2 98%   Visual Acuity Right Eye Distance:   Left Eye Distance:   Bilateral Distance:    Right Eye Near:   Left Eye Near:    Bilateral Near:     Physical Exam Vitals and nursing note reviewed.  Constitutional:      General: He is not in acute distress.    Appearance: Normal appearance. He is not toxic-appearing.  HENT:     Head: Normocephalic.     Right Ear: Ear canal and external ear normal. A middle ear effusion is present. There is no impacted cerumen. No mastoid tenderness. No hemotympanum. Tympanic membrane is not injected, erythematous or bulging.     Left Ear: Ear canal and external ear normal. A middle ear effusion is present. There is no impacted cerumen. No mastoid tenderness. No hemotympanum. Tympanic membrane is not injected, erythematous or bulging.     Ears:     Comments: Serous fluid present behind bilat TM's     Nose: Congestion and rhinorrhea present.     Right Sinus: No maxillary sinus tenderness or frontal sinus tenderness.     Left Sinus: No maxillary sinus tenderness or frontal sinus tenderness.     Mouth/Throat:     Mouth: Mucous membranes are moist.     Pharynx: Oropharynx  is clear. Posterior oropharyngeal erythema and postnasal drip present. No oropharyngeal exudate.     Tonsils: No tonsillar exudate or tonsillar abscesses. 1+ on the right. 1+ on the left.     Comments: Mild oropharynx erythema with posterior injection and post nasal drip  Eyes:     Conjunctiva/sclera: Conjunctivae normal.  Cardiovascular:     Rate and Rhythm: Normal rate and regular rhythm.     Heart sounds:  Normal heart sounds.  Pulmonary:     Effort: Pulmonary effort is normal.     Breath sounds: Normal breath sounds.  Musculoskeletal:     Cervical back: Neck supple.  Lymphadenopathy:     Cervical: No cervical adenopathy.  Skin:    General: Skin is warm and dry.  Neurological:     Mental Status: He is alert and oriented to person, place, and time.  Psychiatric:        Mood and Affect: Mood normal.        Behavior: Behavior normal.      UC Treatments / Results  Labs (all labs ordered are listed, but only abnormal results are displayed) Labs Reviewed - No data to display  EKG   Radiology No results found.  Procedures Procedures (including critical care time)  Medications Ordered in UC Medications - No data to display  Initial Impression / Assessment and Plan / UC Course  I have reviewed the triage vital signs and the nursing notes.  Pertinent labs & imaging results that were available during my care of the patient were reviewed by me and considered in my medical decision making (see chart for details).    Nasopharyngitis: The negative home tests for flu and COVID-19, along with the clinical presentation, led me to suspect a viral head or chest cold. I explained that a high heart rate could be due to the body's inflammatory response to a virus or the decongestant effect of Mucinex. The plan includes continuing the current regimen of Mucinex and Advil , with the option of switching to an extended-release formulation of Mucinex (without phenylephrine or pseudoephedrine  )for convenience. I advised Darvis to maintain good hygiene practices to prevent spreading the virus, such as frequent handwashing and disinfecting surfaces.  Final Clinical Impressions(s) / UC Diagnoses   Final diagnoses:  Nasopharyngitis     Discharge Instructions       Nasopharyngitis is a viral infection of the nose and throat. It commonly causes congestion, runny nose, sore throat, cough, and sinus pressure. Antibiotics are not helpful because this is a viral illness.  Expected Duration & Recovery Symptoms usually peak over the first 3-5 days Gradual improvement occurs over 7-10 days Cough or nasal drainage may last up to 2 weeks Recovery is supported by rest, fluids, and symptom control  Goal of Care The main goal is to reduce inflammation and promote mucus drainage, which helps relieve pressure, cough, and throat irritation.  Treatment & Symptom Management Ibuprofen  (Advil ) 600 mg every 6 hours as needed Why it helps: Reduces inflammation, throat pain, headache, sinus pressure, and fever How to take: Take with food Common side effects: Stomach irritation, heartburn, dizziness Over-the-Counter Cold Medications (per package instructions) Avoid those with pseudoephedrine and phenylephrine due to your hypertension Why they help: Decrease nasal swelling and mucus production to improve drainage Common side effects: Dry mouth, jitteriness, trouble sleeping Steam Showers Loosen mucus Improve sinus and nasal drainage Can be done 1-2 times daily as needed  Why Viral Illnesses Can Cause a Fast Heart Rate During a viral infection: The body releases inflammatory chemicals that increase heart rate Fever raises the bodys metabolic demand, causing the heart to pump faster Dehydration from poor intake or sweating can lower blood volume, increasing heart rate As symptoms improve and hydration and fever resolve, heart rate typically returns to normal.  When seek medical  care Symptoms last longer than 10-14 days Worsening sinus pain or pressure Persistent fever Ongoing rapid heart rate despite hydration  and fever control  Go to the Emergency Department if You Have Shortness of breath or chest pain Fainting or near-fainting Heart rate persistently very high at rest Confusion or signs of severe dehydration     ED Prescriptions   None    PDMP not reviewed this encounter.    [1]  Social History Tobacco Use   Smoking status: Former    Current packs/day: 0.00    Types: Cigarettes    Quit date: 09/04/2003    Years since quitting: 21.0    Passive exposure: Past  Vaping Use   Vaping status: Never Used  Substance Use Topics   Alcohol use: Not Currently   Drug use: No     Leatrice Vernell HERO, NP 09/24/24 1010  "

## 2024-10-02 ENCOUNTER — Encounter: Payer: Self-pay | Admitting: Emergency Medicine

## 2024-10-02 ENCOUNTER — Ambulatory Visit: Admission: EM | Admit: 2024-10-02 | Discharge: 2024-10-02 | Disposition: A | Discharge status: Home / Self Care

## 2024-10-02 DIAGNOSIS — R6889 Other general symptoms and signs: Secondary | ICD-10-CM

## 2024-10-02 DIAGNOSIS — J329 Chronic sinusitis, unspecified: Secondary | ICD-10-CM

## 2024-10-02 DIAGNOSIS — B9689 Other specified bacterial agents as the cause of diseases classified elsewhere: Secondary | ICD-10-CM

## 2024-10-02 LAB — POCT INFLUENZA A/B
Influenza A, POC: NEGATIVE
Influenza B, POC: NEGATIVE

## 2024-10-02 MED ORDER — AMOXICILLIN-POT CLAVULANATE 875-125 MG PO TABS: 1.0000 | ORAL_TABLET | Freq: Two times a day (BID) | ORAL | 0 refills | Status: DC

## 2024-10-02 MED ORDER — GUAIFENESIN ER 1200 MG PO TB12: 1.0000 | ORAL_TABLET | Freq: Two times a day (BID) | ORAL | 0 refills | Status: AC | PRN

## 2024-10-02 MED ORDER — FLUTICASONE PROPIONATE 50 MCG/ACT NA SUSP: 1.0000 | Freq: Every day | NASAL | 0 refills | Status: AC

## 2024-10-02 NOTE — Discharge Instructions (Signed)
" °  You have a bacterial maxillary sinus infection that developed after a recent cold. When sinus drainage becomes blocked, bacteria can grow and cause infection. Treatment includes antibiotics and medications to reduce inflammation and improve mucus drainage.  Common Symptoms - Facial pressure or pain (cheeks, teeth, or under eyes) - Thick nasal drainage (yellow or green) - Nasal congestion - Post-nasal drip - Headache or cough - Symptoms lasting more than 7-10 days or worsening after initial improvement  Expected Recovery - Improvement is usually noticed within 2-3 days of starting antibiotics - Complete the full course of antibiotics, even if you feel better - Congestion and pressure may take 1-2 weeks to fully resolve  Treatment & Symptom Management  Amoxicillin  / Clavulanate Potassium (Augmentin ) - Antibiotic that treats the bacteria causing sinus infection - Take as prescribed, with food to reduce stomach upset - Finish the entire course - Common side effects: diarrhea, nausea, stomach upset, yeast infection - Call the office for rash, severe diarrhea, or allergic reaction symptoms  Guaifenesin  ER (Mucinex ) 1200 mg - Twice Daily - Thins and loosens mucus to improve sinus drainage - Take with a full glass of water - Hydration is important for best effect - Possible side effects: nausea, headache  Fluticasone  Nasal Spray (Flonase ) - Twice Daily - Reduces nasal and sinus inflammation - Use 1 spray in each nostril twice daily (or as directed) - Aim spray away from the center of the nose - May take several days for full benefit - Possible side effects: nasal irritation, dryness, mild nosebleeds  Steam Showers / Warm Moist Air - Helps loosen mucus and relieve sinus pressure - Use caution to avoid burns  Additional Comfort Measures - Saline nasal spray or gentle rinses to help clear mucus - Warm compresses over the cheeks or sinuses - Adequate fluids and rest - Acetaminophen   or ibuprofen  for pain or fever if needed  When to seek medical care - No improvement after 3-5 days of antibiotics - Worsening facial pain or swelling - Persistent fever - Severe or watery diarrhea - Medication side effects or allergic reaction concerns  Go to the Emergency Department If - Swelling or redness around the eyes - Vision changes or eye pain - Severe headache, confusion, or neck stiffness - High fever with worsening symptoms - Trouble breathing or signs of a severe allergic reaction    "

## 2024-10-02 NOTE — ED Triage Notes (Signed)
 Pt c/o sinus pressure, chills, body aches, and congestion for 1 day. States he took covid/flu this morning and was negative.

## 2024-10-08 ENCOUNTER — Ambulatory Visit
Admission: EM | Admit: 2024-10-08 | Discharge: 2024-10-08 | Disposition: A | Discharge status: Home / Self Care | Source: Home / Self Care

## 2024-10-08 ENCOUNTER — Encounter: Payer: Self-pay | Admitting: Emergency Medicine

## 2024-10-08 DIAGNOSIS — I10 Essential (primary) hypertension: Secondary | ICD-10-CM

## 2024-10-08 NOTE — Discharge Instructions (Signed)
-  Please schedule a follow-up with your primary care doctor to discuss blood pressure concerns -Your blood pressure should be about 140/90 or less. You can monitor this at home with an upper-arm cuff. Keep track of these reading and bring to your PCP.  -If you develop symptoms of chest pain, shortness of breath, headache, vision changes, plus high blood pressure - go to the ER.  -Continue using flonase  daily.  -If your sinus symptoms persist, follow-up with an ear nose and throat doctor . See information below.

## 2024-10-08 NOTE — ED Provider Notes (Signed)
 " [facility]    CSN: [id number] Arrival date & time: 10/08/24  0813      History   Chief Complaint Chief Complaint  Patient presents with   Sinusitis   Hypertension    HPI Jahkari Maclin is a 44 y.o. malepresenting for f/u of sinus issues and BP concern. H/o hypertension, for which he takes lisinopril . Was seen on  10/02/24 for sinusitis and was prescribed Augmentin , flonase , and guaifenesin .   Prior to that was seen on 09/24/24 for viral symptoms.  Pt c/o sinus infection for 2-3 weeks, resolving. He has completed the augmentin  yesterday as directed. Notes significant improvement in face pressure and pain. There is still a slight sensation of clogged R nare. States nasal congestion is no longer thick. Stopped Mucinex  1 ay ago. Still taking the flonase .   States his blood pressure was up this morning when he checked it was 150s/100s using wrist cuff. Notes his HR has ranged 70s-80s.  He denies chest pain, shortness of breath, weakness, dizziness, vision changes, headache.      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 Authorizing Provider  acarbose  (PRECOSE ) 100 MG tablet Take 1 tablet (100 mg total) by mouth 3 (three) times daily with meals. 08/31/24   de [place],  Raymond J, MD  atorvastatin  (LIPITOR) 80 MG tablet Take 1 tablet (80 mg total) by mouth daily. 12/25/23   [place], Quintin PARAS, MD  azelastine  (ASTELIN ) 0.1 % nasal spray Place 1 spray into both nostrils 2 (two) times daily. Use in each nostril as directed 07/17/24   Reddick, Johnathan B, NP  Blood Glucose Monitoring Suppl (CONTOUR NEXT MONITOR) w/Device KIT Use to check blood sugars once daily    [provider]  fluticasone  (FLONASE ) 50 MCG/ACT nasal spray Place 1 spray into both nostrils daily. 10/02/24   Leatrice Vernell HERO, NP  glipiZIDE  (GLIPIZIDE  XL) 5 MG 24 hr tablet Take 1 tablet (5 mg total) by mouth daily with breakfast. 05/06/24   [place], Quintin PARAS, MD  glucose blood (CONTOUR NEXT TEST) test strip Use to check FSBG In Vitro once daily    [provider]  Guaifenesin  1200 MG TB12 Take 1 tablet (1,200 mg total) by mouth 2 (two) times daily as needed (Congestion). 10/02/24   Leatrice Vernell HERO, NP  ibuprofen  (ADVIL ) 800 MG tablet Take 1 tablet (800 mg total) by mouth 3 (three) times daily. 07/17/24   Reddick, Johnathan B, NP  lisinopril  (ZESTRIL ) 10 MG tablet Take 1 tablet (10 mg total) by mouth daily. 09/16/24   [place], Raymond J, MD  RYBELSUS  14 MG TABS Take 1 tablet by mouth every morning. 01/24/24   [provider]  Family History Family History  Problem Relation Age of Onset   Diabetes Sister     Social History Social History[1]   Allergies   Metformin hcl   Review of Systems Review of Systems  HENT:  Positive for congestion.      Physical Exam Triage Vital Signs ED Triage Vitals  Encounter Vitals Group     BP      Girls Systolic BP Percentile      Girls Diastolic BP Percentile      Boys Systolic BP Percentile      Boys Diastolic BP Percentile      Pulse      Resp      Temp      Temp src      SpO2      Weight      Height      Head Circumference      Peak Flow      Pain Score      Pain Loc      Pain Education      Exclude from  Growth Chart    No data found.  Updated Vital Signs BP (!) 142/88 (BP Location: Right Arm)   Pulse 88   Temp 97.8 F (36.6 C) (Oral)   Resp 17   SpO2 98%   Visual Acuity Right Eye Distance:   Left Eye Distance:   Bilateral Distance:    Right Eye Near:   Left Eye Near:    Bilateral Near:     Physical Exam Vitals reviewed.  Constitutional:      General: He is not in acute distress.    Appearance: Normal appearance. He is not ill-appearing or diaphoretic.  HENT:     Head: Normocephalic and atraumatic.     Nose:     Right Sinus: No maxillary sinus tenderness or frontal sinus tenderness.     Left Sinus: No maxillary sinus tenderness or frontal sinus tenderness.     Mouth/Throat:     Pharynx: No posterior oropharyngeal erythema.  Cardiovascular:     Rate and Rhythm: Normal rate and regular rhythm.     Heart sounds: Normal heart sounds.  Pulmonary:     Effort: Pulmonary effort is normal.     Breath sounds: Normal breath sounds.  Skin:    General: Skin is warm.  Neurological:     General: No focal deficit present.     Mental Status: He is alert and oriented to person, place, and time.  Psychiatric:        Mood and Affect: Mood normal.        Behavior: Behavior normal.        Thought Content: Thought content normal.        Judgment: Judgment normal.      UC Treatments / Results  Labs (all labs ordered are listed, but only abnormal results are displayed) Labs Reviewed - No data to display  EKG   Radiology No results found.  Procedures Procedures (including critical care time)  Medications Ordered in UC Medications - No data to display  Initial Impression / Assessment and Plan / UC Course  I have reviewed the triage vital signs and the nursing notes.  Pertinent labs & imaging results that were available during my care of the patient were reviewed by me and considered in my medical decision making (see chart for details).     Patient is a pleasant 44  y.o. male presenting for follow-up of sinusitis, and blood  pressure check. The patient is afebrile and nontachycardic.  Antipyretic has not been administered today.  At today's visit, his blood pressure was 142/88, and he denies symptoms of chest pain, shortness of breath, headache, dizziness, vision changes.  However, his blood pressure at home has ranged as high as 150/100 per home cuff, which is a wrist cuff.  He has been checking his blood pressure multiple times a day, and there may be an anxiety component.  I recommended that he check his blood pressure 1-2 times a week, and keep track of these numbers.  He needs to schedule a follow-up with his primary care provider to discuss this further.  His sinus infection has resolved.  However, if he has persistent nasal congestion or sensation of clogged right nare, in about 1 week, then he should follow-up with ENT.  Information provided.  For now, he will continue Flonase  daily.  ER return precautions as below.    Final Clinical Impressions(s) / UC Diagnoses   Final diagnoses:  Essential hypertension     Discharge Instructions      -Please schedule a follow-up with your primary care doctor to discuss blood pressure concerns -Your blood pressure should be about 140/90 or less. You can monitor this at home with an upper-arm cuff. Keep track of these reading and bring to your PCP.  -If you develop symptoms of chest pain, shortness of breath, headache, vision changes, plus high blood pressure - go to the ER.  -Continue using flonase  daily.  -If your sinus symptoms persist, follow-up with an ear nose and throat doctor . See information below.      ED Prescriptions   None    PDMP not reviewed this encounter.     [1]  Social History Tobacco Use   Smoking status: Former    Current packs/day: 0.00    Types: Cigarettes    Quit date: 09/04/2003    Years since quitting: 21.1    Passive exposure: Past  Vaping Use   Vaping status:  Never Used  Substance Use Topics   Alcohol use: Not Currently   Drug use: No     Arlyss Leita BRAVO, PA-C 10/08/24 9157  "

## 2024-10-08 NOTE — ED Triage Notes (Signed)
 Pt c/o sinus infection for 2-3 weeks. States his blood pressure was up this morning when he checked it was 150s/100s.   He was seen on 1/30 at [facility] and given abx.

## 2024-10-09 ENCOUNTER — Encounter (HOSPITAL_BASED_OUTPATIENT_CLINIC_OR_DEPARTMENT_OTHER): Payer: Self-pay | Admitting: Family Medicine

## 2024-10-09 ENCOUNTER — Ambulatory Visit (HOSPITAL_BASED_OUTPATIENT_CLINIC_OR_DEPARTMENT_OTHER): Admitting: Family Medicine

## 2024-10-09 VITALS — BP 117/94 | HR 85 | Temp 97.6°F | Resp 18 | Ht 68.0 in | Wt 198.0 lb

## 2024-10-09 DIAGNOSIS — I10 Essential (primary) hypertension: Secondary | ICD-10-CM

## 2024-10-09 DIAGNOSIS — R0981 Nasal congestion: Secondary | ICD-10-CM | POA: Insufficient documentation

## 2024-10-09 NOTE — Progress Notes (Unsigned)
" ° ° °  Procedures performed today:    None.  Independent interpretation of notes and tests performed by another provider:   None.  Brief History, Exam, Impression, and Recommendations:    BP (!) 128/96 (BP Location: Left Arm, Patient Position: Sitting, Cuff Size: Normal)   Pulse 81   Temp 97.6 F (36.4 C) (Oral)   Resp 18   Ht 5' 8 (1.727 m)   Wt 198 lb (89.8 kg)   SpO2 100%   BMI 30.11 kg/m   Discussed the use of AI scribe software for clinical note transcription with the patient, who gave verbal consent to proceed.  History of Present Illness   Assessment and Plan Assessment & Plan   There are no diagnoses linked to this encounter.  Return in about 3 weeks (around 10/30/2024) for hypertension.   ___________________________________________ Pernell Dikes [place], MD, ABFM, CAQSM Primary Care and Sports Medicine [facility] "

## 2024-10-28 ENCOUNTER — Ambulatory Visit (HOSPITAL_BASED_OUTPATIENT_CLINIC_OR_DEPARTMENT_OTHER): Admitting: Family Medicine

## 2025-02-04 ENCOUNTER — Ambulatory Visit (HOSPITAL_BASED_OUTPATIENT_CLINIC_OR_DEPARTMENT_OTHER): Admitting: Family Medicine
